# Patient Record
Sex: Female | Born: 1970 | Race: Black or African American | Hispanic: No | Marital: Married | Smoking: Never smoker
Health system: Southern US, Community
[De-identification: ages and names within clinical notes are randomized; demographics above are authoritative.]

## PROBLEM LIST (undated history)

## (undated) DIAGNOSIS — E782 Mixed hyperlipidemia: Secondary | ICD-10-CM

## (undated) DIAGNOSIS — R51 Headache: Secondary | ICD-10-CM

## (undated) DIAGNOSIS — I10 Essential (primary) hypertension: Secondary | ICD-10-CM

## (undated) DIAGNOSIS — Z8719 Personal history of other diseases of the digestive system: Secondary | ICD-10-CM

## (undated) DIAGNOSIS — J45909 Unspecified asthma, uncomplicated: Secondary | ICD-10-CM

## (undated) DIAGNOSIS — K219 Gastro-esophageal reflux disease without esophagitis: Secondary | ICD-10-CM

## (undated) DIAGNOSIS — T4145XA Adverse effect of unspecified anesthetic, initial encounter: Secondary | ICD-10-CM

## (undated) DIAGNOSIS — T8859XA Other complications of anesthesia, initial encounter: Secondary | ICD-10-CM

## (undated) DIAGNOSIS — R519 Headache, unspecified: Secondary | ICD-10-CM

## (undated) DIAGNOSIS — E559 Vitamin D deficiency, unspecified: Secondary | ICD-10-CM

## (undated) DIAGNOSIS — R011 Cardiac murmur, unspecified: Secondary | ICD-10-CM

## (undated) DIAGNOSIS — Z889 Allergy status to unspecified drugs, medicaments and biological substances status: Secondary | ICD-10-CM

## (undated) HISTORY — PX: TONSILLECTOMY: SUR1361

## (undated) HISTORY — PX: NISSEN FUNDOPLICATION: SHX2091

## (undated) HISTORY — PX: UPPER GI ENDOSCOPY: SHX6162

## (undated) HISTORY — PX: HIATAL HERNIA REPAIR: SHX195

## (undated) HISTORY — PX: COLONOSCOPY: SHX174

## (undated) HISTORY — DX: Mixed hyperlipidemia: E78.2

## (undated) HISTORY — PX: ABDOMINAL HYSTERECTOMY: SHX81

## (undated) HISTORY — PX: HERNIA REPAIR: SHX51

## (undated) HISTORY — DX: Personal history of other diseases of the digestive system: Z87.19

## (undated) HISTORY — DX: Vitamin D deficiency, unspecified: E55.9

---
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
## 2014-04-11 ENCOUNTER — Other Ambulatory Visit (INDEPENDENT_AMBULATORY_CARE_PROVIDER_SITE_OTHER): Payer: Self-pay | Admitting: Otolaryngology

## 2014-04-11 DIAGNOSIS — J329 Chronic sinusitis, unspecified: Secondary | ICD-10-CM

## 2014-04-14 ENCOUNTER — Ambulatory Visit
Admission: RE | Admit: 2014-04-14 | Discharge: 2014-04-14 | Disposition: A | Discharge status: Home / Self Care | Payer: 59 | Source: Ambulatory Visit | Attending: Otolaryngology | Admitting: Otolaryngology

## 2014-04-14 DIAGNOSIS — J329 Chronic sinusitis, unspecified: Secondary | ICD-10-CM

## 2014-05-01 ENCOUNTER — Other Ambulatory Visit: Payer: Self-pay | Admitting: Otolaryngology

## 2014-05-10 ENCOUNTER — Encounter (HOSPITAL_BASED_OUTPATIENT_CLINIC_OR_DEPARTMENT_OTHER): Payer: Self-pay | Admitting: *Deleted

## 2014-05-10 NOTE — Progress Notes (Signed)
To come in for ekg-bmet-recently moved from [place]-no md here yet

## 2014-05-11 ENCOUNTER — Other Ambulatory Visit: Payer: Self-pay

## 2014-05-11 ENCOUNTER — Encounter (HOSPITAL_BASED_OUTPATIENT_CLINIC_OR_DEPARTMENT_OTHER)
Admission: RE | Admit: 2014-05-11 | Discharge: 2014-05-11 | Disposition: A | Discharge status: Home / Self Care | Payer: 59 | Source: Ambulatory Visit | Attending: Otolaryngology | Admitting: Otolaryngology

## 2014-05-11 DIAGNOSIS — Z01812 Encounter for preprocedural laboratory examination: Secondary | ICD-10-CM | POA: Diagnosis not present

## 2014-05-11 DIAGNOSIS — Z01810 Encounter for preprocedural cardiovascular examination: Secondary | ICD-10-CM | POA: Insufficient documentation

## 2014-05-11 LAB — BASIC METABOLIC PANEL
Anion gap: 9 (ref 5–15)
BUN: 12 mg/dL (ref 6–23)
CALCIUM: 8.8 mg/dL (ref 8.4–10.5)
CO2: 26 mmol/L (ref 19–32)
Chloride: 100 mmol/L (ref 96–112)
Creatinine, Ser: 0.97 mg/dL (ref 0.50–1.10)
GFR calc Af Amer: 80 mL/min — ABNORMAL LOW (ref >90)
GFR calc non Af Amer: 69 mL/min — ABNORMAL LOW (ref >90)
GLUCOSE: 95 mg/dL (ref 70–99)
POTASSIUM: 4.6 mmol/L (ref 3.5–5.1)
Sodium: 135 mmol/L (ref 135–145)

## 2014-05-16 ENCOUNTER — Ambulatory Visit (HOSPITAL_BASED_OUTPATIENT_CLINIC_OR_DEPARTMENT_OTHER)
Admission: RE | Admit: 2014-05-16 | Discharge: 2014-05-16 | Disposition: A | Discharge status: Home / Self Care | Payer: 59 | Source: Ambulatory Visit | Attending: Otolaryngology | Admitting: Otolaryngology

## 2014-05-16 ENCOUNTER — Ambulatory Visit (HOSPITAL_BASED_OUTPATIENT_CLINIC_OR_DEPARTMENT_OTHER): Payer: 59 | Admitting: Anesthesiology

## 2014-05-16 ENCOUNTER — Encounter (HOSPITAL_BASED_OUTPATIENT_CLINIC_OR_DEPARTMENT_OTHER): Payer: Self-pay | Admitting: Anesthesiology

## 2014-05-16 ENCOUNTER — Encounter (HOSPITAL_BASED_OUTPATIENT_CLINIC_OR_DEPARTMENT_OTHER)
Admission: RE | Disposition: A | Discharge status: Home / Self Care | Payer: Self-pay | Source: Ambulatory Visit | Attending: Otolaryngology

## 2014-05-16 DIAGNOSIS — J343 Hypertrophy of nasal turbinates: Secondary | ICD-10-CM | POA: Insufficient documentation

## 2014-05-16 DIAGNOSIS — I10 Essential (primary) hypertension: Secondary | ICD-10-CM | POA: Diagnosis not present

## 2014-05-16 DIAGNOSIS — Z79899 Other long term (current) drug therapy: Secondary | ICD-10-CM | POA: Diagnosis not present

## 2014-05-16 DIAGNOSIS — J342 Deviated nasal septum: Secondary | ICD-10-CM | POA: Diagnosis present

## 2014-05-16 DIAGNOSIS — J45909 Unspecified asthma, uncomplicated: Secondary | ICD-10-CM | POA: Insufficient documentation

## 2014-05-16 HISTORY — PX: TURBINATE REDUCTION: SHX6157

## 2014-05-16 HISTORY — DX: Unspecified asthma, uncomplicated: J45.909

## 2014-05-16 HISTORY — DX: Allergy status to unspecified drugs, medicaments and biological substances: Z88.9

## 2014-05-16 HISTORY — DX: Essential (primary) hypertension: I10

## 2014-05-16 HISTORY — PX: SEPTOPLASTY: SHX2393

## 2014-05-16 HISTORY — DX: Gastro-esophageal reflux disease without esophagitis: K21.9

## 2014-05-16 HISTORY — DX: Cardiac murmur, unspecified: R01.1

## 2014-05-16 LAB — POCT HEMOGLOBIN-HEMACUE: Hemoglobin: 13.9 g/dL (ref 12.0–15.0)

## 2014-05-16 SURGERY — SEPTOPLASTY, NOSE
Anesthesia: General

## 2014-05-16 MED ORDER — DEXAMETHASONE SODIUM PHOSPHATE 4 MG/ML IJ SOLN
INTRAMUSCULAR | Status: DC | PRN
  Administered 2014-05-16: 10 mg via INTRAVENOUS

## 2014-05-16 MED ORDER — OXYCODONE-ACETAMINOPHEN 5-325 MG PO TABS: 1.0000 | ORAL_TABLET | ORAL | Status: DC | PRN

## 2014-05-16 MED ORDER — MUPIROCIN 2 % EX OINT
TOPICAL_OINTMENT | CUTANEOUS | Status: DC | PRN
  Administered 2014-05-16: 1 via NASAL

## 2014-05-16 MED ORDER — HYDROMORPHONE HCL 1 MG/ML IJ SOLN
INTRAMUSCULAR | Status: AC
  Filled 2014-05-16: qty 1

## 2014-05-16 MED ORDER — PROMETHAZINE HCL 25 MG/ML IJ SOLN: 6.2500 mg | INTRAMUSCULAR | Status: DC | PRN

## 2014-05-16 MED ORDER — OXYMETAZOLINE HCL 0.05 % NA SOLN
NASAL | Status: DC | PRN
  Administered 2014-05-16: 1 via NASAL

## 2014-05-16 MED ORDER — LACTATED RINGERS IV SOLN
INTRAVENOUS | Status: DC
  Administered 2014-05-16 (×3): via INTRAVENOUS

## 2014-05-16 MED ORDER — MIDAZOLAM HCL 2 MG/2ML IJ SOLN
1.0000 mg | INTRAMUSCULAR | Status: DC | PRN
  Administered 2014-05-16: 2 mg via INTRAVENOUS

## 2014-05-16 MED ORDER — LIDOCAINE-EPINEPHRINE 1 %-1:100000 IJ SOLN
INTRAMUSCULAR | Status: DC | PRN
  Administered 2014-05-16: 2 mL

## 2014-05-16 MED ORDER — OXYMETAZOLINE HCL 0.05 % NA SOLN
NASAL | Status: AC
  Filled 2014-05-16: qty 15

## 2014-05-16 MED ORDER — MEPERIDINE HCL 25 MG/ML IJ SOLN: 6.2500 mg | INTRAMUSCULAR | Status: DC | PRN

## 2014-05-16 MED ORDER — FENTANYL CITRATE 0.05 MG/ML IJ SOLN
50.0000 ug | INTRAMUSCULAR | Status: DC | PRN
  Administered 2014-05-16: 100 ug via INTRAVENOUS

## 2014-05-16 MED ORDER — LIDOCAINE-EPINEPHRINE 1 %-1:100000 IJ SOLN
INTRAMUSCULAR | Status: AC
  Filled 2014-05-16: qty 1

## 2014-05-16 MED ORDER — LIDOCAINE HCL (CARDIAC) 20 MG/ML IV SOLN
INTRAVENOUS | Status: DC | PRN
Start: 2014-05-16 — End: 2014-05-16
  Administered 2014-05-16: 50 mg via INTRAVENOUS

## 2014-05-16 MED ORDER — PROPOFOL 10 MG/ML IV BOLUS
INTRAVENOUS | Status: DC | PRN
  Administered 2014-05-16: 200 mg via INTRAVENOUS

## 2014-05-16 MED ORDER — EPINEPHRINE HCL 1 MG/ML IJ SOLN
INTRAMUSCULAR | Status: AC
  Filled 2014-05-16: qty 1

## 2014-05-16 MED ORDER — AMOXICILLIN 875 MG PO TABS: 875.0000 mg | ORAL_TABLET | Freq: Two times a day (BID) | ORAL | Status: DC

## 2014-05-16 MED ORDER — CEFAZOLIN SODIUM-DEXTROSE 2-3 GM-% IV SOLR
INTRAVENOUS | Status: DC | PRN
  Administered 2014-05-16: 2 g via INTRAVENOUS

## 2014-05-16 MED ORDER — BACITRACIN ZINC 500 UNIT/GM EX OINT
TOPICAL_OINTMENT | CUTANEOUS | Status: AC
  Filled 2014-05-16: qty 28.35

## 2014-05-16 MED ORDER — ONDANSETRON HCL 4 MG/2ML IJ SOLN
INTRAMUSCULAR | Status: DC | PRN
  Administered 2014-05-16: 4 mg via INTRAVENOUS

## 2014-05-16 MED ORDER — SUCCINYLCHOLINE CHLORIDE 20 MG/ML IJ SOLN
INTRAMUSCULAR | Status: DC | PRN
  Administered 2014-05-16: 100 mg via INTRAVENOUS

## 2014-05-16 MED ORDER — MIDAZOLAM HCL 2 MG/2ML IJ SOLN
INTRAMUSCULAR | Status: AC
  Filled 2014-05-16: qty 2

## 2014-05-16 MED ORDER — MUPIROCIN 2 % EX OINT
TOPICAL_OINTMENT | CUTANEOUS | Status: AC
  Filled 2014-05-16: qty 22

## 2014-05-16 MED ORDER — MIDAZOLAM HCL 2 MG/2ML IJ SOLN: 0.5000 mg | Freq: Once | INTRAMUSCULAR | Status: DC | PRN

## 2014-05-16 MED ORDER — HYDROMORPHONE HCL 1 MG/ML IJ SOLN
0.2500 mg | INTRAMUSCULAR | Status: DC | PRN
  Administered 2014-05-16 (×3): 0.5 mg via INTRAVENOUS

## 2014-05-16 MED ORDER — BACITRACIN ZINC 500 UNIT/GM EX OINT
TOPICAL_OINTMENT | CUTANEOUS | Status: AC
  Filled 2014-05-16: qty 0.9

## 2014-05-16 MED ORDER — OXYCODONE HCL 5 MG PO TABS
5.0000 mg | ORAL_TABLET | Freq: Once | ORAL | Status: AC
  Administered 2014-05-16: 5 mg via ORAL

## 2014-05-16 MED ORDER — OXYCODONE HCL 5 MG PO TABS
ORAL_TABLET | ORAL | Status: AC
  Filled 2014-05-16: qty 1

## 2014-05-16 MED ORDER — FENTANYL CITRATE (PF) 100 MCG/2ML IJ SOLN
INTRAMUSCULAR | Status: AC
  Filled 2014-05-16: qty 4

## 2014-05-16 SURGICAL SUPPLY — 36 items
ATTRACTOMAT 16X20 MAGNETIC DRP (DRAPES) IMPLANT
BLADE SURG 15 STRL LF DISP TIS (BLADE) ×2 IMPLANT
BLADE SURG 15 STRL SS (BLADE) ×1
CANISTER SUCT 1200ML W/VALVE (MISCELLANEOUS) ×3 IMPLANT
COAGULATOR SUCT 8FR VV (MISCELLANEOUS) ×3 IMPLANT
DECANTER SPIKE VIAL GLASS SM (MISCELLANEOUS) IMPLANT
DRSG NASOPORE 8CM (GAUZE/BANDAGES/DRESSINGS) IMPLANT
DRSG TELFA 3X8 NADH (GAUZE/BANDAGES/DRESSINGS) IMPLANT
ELECT REM PT RETURN 9FT ADLT (ELECTROSURGICAL) ×3
ELECTRODE REM PT RTRN 9FT ADLT (ELECTROSURGICAL) ×2 IMPLANT
GLOVE BIO SURGEON STRL SZ7 (GLOVE) ×3 IMPLANT
GLOVE BIO SURGEON STRL SZ7.5 (GLOVE) ×3 IMPLANT
GLOVE BIOGEL PI IND STRL 7.5 (GLOVE) ×2 IMPLANT
GLOVE BIOGEL PI INDICATOR 7.5 (GLOVE) ×1
GLOVE SURG SS PI 7.0 STRL IVOR (GLOVE) ×3 IMPLANT
GOWN STRL REUS W/ TWL LRG LVL3 (GOWN DISPOSABLE) ×6 IMPLANT
GOWN STRL REUS W/TWL LRG LVL3 (GOWN DISPOSABLE) ×3
NEEDLE HYPO 25X1 1.5 SAFETY (NEEDLE) ×3 IMPLANT
NS IRRIG 1000ML POUR BTL (IV SOLUTION) ×3 IMPLANT
PACK BASIN DAY SURGERY FS (CUSTOM PROCEDURE TRAY) ×3 IMPLANT
PACK ENT DAY SURGERY (CUSTOM PROCEDURE TRAY) IMPLANT
PATTIES SURGICAL .5 X3 (DISPOSABLE) IMPLANT
SLEEVE SCD COMPRESS KNEE MED (MISCELLANEOUS) ×3 IMPLANT
SOLUTION BUTLER CLEAR DIP (MISCELLANEOUS) ×3 IMPLANT
SPLINT NASAL AIRWAY SILICONE (MISCELLANEOUS) ×3 IMPLANT
SPONGE GAUZE 2X2 8PLY STRL LF (GAUZE/BANDAGES/DRESSINGS) ×3 IMPLANT
SPONGE NEURO XRAY DETECT 1X3 (DISPOSABLE) ×3 IMPLANT
SUT CHROMIC 4 0 P 3 18 (SUTURE) ×3 IMPLANT
SUT PLAIN 4 0 ~~LOC~~ 1 (SUTURE) ×3 IMPLANT
SUT PROLENE 3 0 PS 2 (SUTURE) ×3 IMPLANT
SUT VIC AB 4-0 P-3 18XBRD (SUTURE) IMPLANT
SUT VIC AB 4-0 P3 18 (SUTURE)
TOWEL OR 17X24 6PK STRL BLUE (TOWEL DISPOSABLE) ×3 IMPLANT
TUBE SALEM SUMP 12R W/ARV (TUBING) IMPLANT
TUBE SALEM SUMP 16 FR W/ARV (TUBING) IMPLANT
YANKAUER SUCT BULB TIP NO VENT (SUCTIONS) ×3 IMPLANT

## 2014-05-16 NOTE — Transfer of Care (Signed)
Immediate Anesthesia Transfer of Care Note  Patient: Jean Kemp  Procedure(s) Performed: Procedure(s): SEPTOPLASTY (N/A) BILATERAL TURBINATE REDUCTION (Bilateral)  Patient Location: PACU  Anesthesia Type:General  Level of Consciousness: sedated  Airway & Oxygen Therapy: Patient Spontanous Breathing and Patient connected to face mask oxygen  Post-op Assessment: Report given to RN and Post -op Vital signs reviewed and stable  Post vital signs: Reviewed and stable  Last Vitals:  Filed Vitals:   05/16/14 0652  BP: 129/75  Pulse: 72  Temp: 36.5 C  Resp: 18    Complications: No apparent anesthesia complications

## 2014-05-16 NOTE — H&P (Signed)
H&P Update  Pt's original H&P dated 04/26/14 reviewed and placed in chart (to be scanned).  I personally examined the patient today.  No change in health. Proceed with septoplasty and bilateral turbinate reduction.

## 2014-05-16 NOTE — Anesthesia Postprocedure Evaluation (Signed)
  Anesthesia Post-op Note  Patient: Jean Kemp  Procedure(s) Performed: Procedure(s): SEPTOPLASTY (N/A) BILATERAL TURBINATE REDUCTION (Bilateral)  Patient Location: PACU  Anesthesia Type: General   Level of Consciousness: awake, alert  and oriented  Airway and Oxygen Therapy: Patient Spontanous Breathing  Post-op Pain: mild  Post-op Assessment: Post-op Vital signs reviewed  Post-op Vital Signs: Reviewed  Last Vitals:  Filed Vitals:   05/16/14 0945  BP: 141/86  Pulse: 91  Temp:   Resp: 14    Complications: No apparent anesthesia complications

## 2014-05-16 NOTE — Op Note (Signed)
DATE OF PROCEDURE: 05/16/2014  OPERATIVE REPORT   SURGEON: Leta Baptist, MD   PREOPERATIVE DIAGNOSES:  1. Severe nasal septal deviation.  2. Bilateral inferior turbinate hypertrophy.  3. Chronic nasal obstruction.  POSTOPERATIVE DIAGNOSES:  1. Severe nasal septal deviation.  2. Bilateral inferior turbinate hypertrophy.  3. Chronic nasal obstruction.  PROCEDURE PERFORMED:  1. Septoplasty.  2. Bilateral partial inferior turbinate resection.   ANESTHESIA: General endotracheal tube anesthesia.   COMPLICATIONS: None.   ESTIMATED BLOOD LOSS: Less than 20 mL.   INDICATION FOR PROCEDURE: Jean Kemp is a 44 y.o. female with a history of chronic nasal obstruction. The patient was treated with antihistamine, decongestant, steroid nasal spray, and systemic steroids. However, the patient continues to be symptomatic. On examination, the patient was noted to have bilateral severe inferior turbinate hypertrophy and significant nasal septal deviation, causing significant nasal obstruction. Based on the above findings, the decision was made for the patient to undergo the above-stated procedure. The risks, benefits, alternatives, and details of the procedure were discussed with the patient. Questions were invited and answered. Informed consent was obtained.   DESCRIPTION OF PROCEDURE: The patient was taken to the operating room and placed supine on the operating table. General endotracheal tube anesthesia was administered by the anesthesiologist. The patient was positioned, and prepped and draped in the standard fashion for nasal surgery. Pledgets soaked with Afrin were placed in both nasal cavities for decongestion. The pledgets were subsequently removed. The above mentioned severe septal deviation was again noted. 1% lidocaine with 1:100,000 epinephrine was injected onto the nasal septum bilaterally. A hemitransfixion incision was made on the left side. The mucosal flap was carefully elevated on the left  side. A cartilaginous incision was made 1 cm superior to the caudal margin of the nasal septum. Mucosal flap was also elevated on the right side in the similar fashion. It should be noted that due to the severe septal deviation, the deviated portion of the cartilaginous and bony septum had to be removed in piecemeal fashion. Once the deviated portions were removed, a straight midline septum was achieved. The septum was then quilted with 4-0 plain gut sutures. The hemitransfixion incision was closed with interrupted 4-0 chromic sutures. Doyle splints were applied.   Prior to the [facility] splint application, the inferior one half of both hypertrophied inferior turbinate was crossclamped with a Kelly clamp. The inferior one half of each inferior turbinate was then resected with a pair of cross cutting scissors. Hemostasis was achieved with a suction cautery device.   The care of the patient was turned over to the anesthesiologist. The patient was awakened from anesthesia without difficulty. The patient was extubated and transferred to the recovery room in good condition.   OPERATIVE FINDINGS: Severe nasal septal deviation and bilateral inferior turbinate hypertrophy.   SPECIMEN: None.   FOLLOWUP CARE: The patient be discharged home once she is awake and alert. The patient will be placed on Percocet 1-2 tablets p.o. q.6 hours p.r.n. pain, and amoxicillin 875 mg p.o. b.i.d. for 5 days. The patient will follow up in my office in approximately 1 week for splint removal.   Linkyn Gobin Raynelle Bring, MD

## 2014-05-16 NOTE — Anesthesia Procedure Notes (Signed)
Procedure Name: Intubation Date/Time: 05/16/2014 7:57 AM Performed by: Lieutenant Diego Pre-anesthesia Checklist: Patient identified, Emergency Drugs available, Suction available and Patient being monitored Patient Re-evaluated:Patient Re-evaluated prior to inductionOxygen Delivery Method: Circle System Utilized Preoxygenation: Pre-oxygenation with 100% oxygen Intubation Type: IV induction Ventilation: Mask ventilation without difficulty Laryngoscope Size: Miller and 2 Grade View: Grade I Tube type: Oral Number of attempts: 1 Airway Equipment and Method: Stylet and Oral airway Placement Confirmation: ETT inserted through vocal cords under direct vision,  positive ETCO2 and breath sounds checked- equal and bilateral Secured at: 21 cm Tube secured with: Tape Dental Injury: Teeth and Oropharynx as per pre-operative assessment

## 2014-05-16 NOTE — Anesthesia Preprocedure Evaluation (Addendum)
Anesthesia Evaluation  Patient identified by MRN, date of birth, ID band Patient awake    Reviewed: Allergy & Precautions, NPO status , Patient's Chart, lab work & pertinent test results  History of Anesthesia Complications Negative for: history of anesthetic complications  Airway Mallampati: I  TM Distance: >3 FB Neck ROM: Full    Dental  (+) Teeth Intact, Dental Advisory Given   Pulmonary neg pulmonary ROS, asthma (well controlled, never needs inhaler) ,  breath sounds clear to auscultation        Cardiovascular hypertension, Pt. on medications Rhythm:Regular Rate:Normal + Systolic Click    Neuro/Psych negative neurological ROS     GI/Hepatic Neg liver ROS, GERD-  Controlled,S/p [employer]   Endo/Other  negative endocrine ROS  Renal/GU negative Renal ROS     Musculoskeletal   Abdominal   Peds  Hematology negative hematology ROS (+)   Anesthesia Other Findings   Reproductive/Obstetrics                            Anesthesia Physical Anesthesia Plan  ASA: II  Anesthesia Plan: General   Post-op Pain Management:    Induction: Intravenous  Airway Management Planned: Oral ETT  Additional Equipment:   Intra-op Plan:   Post-operative Plan: Extubation in OR  Informed Consent: I have reviewed the patients History and Physical, chart, labs and discussed the procedure including the risks, benefits and alternatives for the proposed anesthesia with the patient or authorized representative who has indicated his/her understanding and acceptance.   Dental advisory given  Plan Discussed with: CRNA and Surgeon  Anesthesia Plan Comments: (Plan routine monitors, GETA)        Anesthesia Quick Evaluation

## 2014-05-16 NOTE — Discharge Instructions (Addendum)

## 2014-05-17 ENCOUNTER — Encounter (HOSPITAL_BASED_OUTPATIENT_CLINIC_OR_DEPARTMENT_OTHER): Payer: Self-pay | Admitting: Otolaryngology

## 2015-01-03 LAB — HEPATIC FUNCTION PANEL
ALT: 18 U/L (ref 7–35)
AST: 13 U/L (ref 13–35)

## 2015-01-03 LAB — LIPID PANEL
Cholesterol: 265 mg/dL — AB (ref 0–200)
HDL: 82 mg/dL — AB (ref 35–70)
LDL Cholesterol: 188 mg/dL

## 2015-01-03 LAB — TSH: TSH: 1.17 u[IU]/mL (ref 0.41–5.90)

## 2015-01-03 LAB — CBC AND DIFFERENTIAL
Hemoglobin: 15 g/dL (ref 12.0–16.0)
PLATELETS: 414 10*3/uL — AB (ref 150–399)
WBC: 5.9 10*3/mL

## 2015-01-03 LAB — BASIC METABOLIC PANEL
BUN: 1 mg/dL — AB (ref 4–21)
GLUCOSE: 123 mg/dL
POTASSIUM: 4.8 mmol/L (ref 3.4–5.3)
SODIUM: 135 mmol/L — AB (ref 137–147)

## 2015-01-03 LAB — HEMOGLOBIN A1C: Hemoglobin A1C: 5.7

## 2015-01-03 LAB — HM MAMMOGRAPHY

## 2015-01-03 LAB — CALCIUM: CALCIUM: 10.1 mg/dL

## 2015-02-02 ENCOUNTER — Encounter: Payer: Self-pay | Admitting: Emergency Medicine

## 2015-02-02 ENCOUNTER — Emergency Department (INDEPENDENT_AMBULATORY_CARE_PROVIDER_SITE_OTHER)
Admission: EM | Admit: 2015-02-02 | Discharge: 2015-02-02 | Disposition: A | Discharge status: Home / Self Care | Payer: 59 | Source: Home / Self Care | Attending: Family Medicine | Admitting: Family Medicine

## 2015-02-02 DIAGNOSIS — J0100 Acute maxillary sinusitis, unspecified: Secondary | ICD-10-CM

## 2015-02-02 DIAGNOSIS — J029 Acute pharyngitis, unspecified: Secondary | ICD-10-CM

## 2015-02-02 LAB — POCT RAPID STREP A (OFFICE): Rapid Strep A Screen: NEGATIVE

## 2015-02-02 MED ORDER — AMOXICILLIN 875 MG PO TABS: 875.0000 mg | ORAL_TABLET | Freq: Two times a day (BID) | ORAL | Status: DC

## 2015-02-02 MED ORDER — PREDNISONE 20 MG PO TABS: 20.0000 mg | ORAL_TABLET | Freq: Two times a day (BID) | ORAL | Status: DC

## 2015-02-02 NOTE — Discharge Instructions (Signed)
Take plain guaifenesin (1200mg  extended release tabs such as Mucinex) twice daily, with plenty of water, for cough and congestion.  May add Pseudoephedrine (30mg , one or two every 4 to 6 hours) for sinus congestion.  Get adequate rest.   May use Afrin nasal spray (or generic oxymetazoline) twice daily for about 5 days and then discontinue.  Also recommend using saline nasal spray several times daily and saline nasal irrigation (AYR is a common brand).  Use Flonase nasal spray each morning after using Afrin nasal spray and saline nasal irrigation. Try warm salt water gargles for sore throat.  Stop all antihistamines for now, and other non-prescription cough/cold preparations. May take Delsym Cough Suppressant at bedtime for nighttime cough.    Follow-up with family doctor if not improving about 7 to10 days.

## 2015-02-02 NOTE — ED Notes (Signed)
Sinus pain, pressure, headache left earache, congestion, yellow-to green mucus x 2 weeks worse in the last two days

## 2015-02-02 NOTE — ED Provider Notes (Signed)
CSN: [id number]     Arrival date & time 02/02/15  0854 History   First MD Initiated Contact with Patient 02/02/15 [phone]     Chief Complaint  Patient presents with  . Sinus Problem      HPI Comments: Patient reports that she had a cold-like illness about 2 weeks ago that generally improved except for persistent nasal congestion.  Yesterday she developed left earache, left sinus headache, and mild sore throat. She has a past history of septoplasty in April 2016.  In March of 2016 a maxillofacial CT showed mucosal thickening of her left maxillary sinuses.  The history is provided by the patient.    Past Medical History  Diagnosis Date  . Multiple allergies   . Asthma   . Hypertension   . GERD (gastroesophageal reflux disease)   . Heart murmur     mvp-never had echo   Past Surgical History  Procedure Laterality Date  . Nissen fundoplication    . Hiatal hernia repair    . Abdominal hysterectomy    . Upper gi endoscopy    . Colonoscopy    . Tonsillectomy    . Septoplasty N/A 05/16/2014    Procedure: SEPTOPLASTY;  Surgeon: Leta Baptist, MD;  Location: [place];  Service: ENT;  Laterality: N/A;  . Turbinate reduction Bilateral 05/16/2014    Procedure: BILATERAL TURBINATE REDUCTION;  Surgeon: Leta Baptist, MD;  Location: [place];  Service: ENT;  Laterality: Bilateral;   No family history on file. Social History  Substance Use Topics  . Smoking status: Never Smoker   . Smokeless tobacco: None  . Alcohol Use: Yes     Comment: occ   OB History    No data available     Review of Systems + sore throat + cough + sneezing No pleuritic pain No wheezing + nasal congestion + post-nasal drainage + sinus pain/pressure No itchy/red eyes ? earache No hemoptysis No SOB No fever/chills No nausea No vomiting No abdominal pain No diarrhea No urinary symptoms No skin rash + fatigue No myalgias + headache Used OTC meds without relief  Allergies  Asa;  Ibuprofen; and Sulfa antibiotics  Home Medications   Prior to Admission medications   Medication Sig Start Date End Date Taking? Authorizing Provider  amoxicillin (AMOXIL) 875 MG tablet Take 1 tablet (875 mg total) by mouth 2 (two) times daily. 02/02/15   Kandra Nicolas, MD  Eflornithine HCl 13.9 % cream Apply 13.9 % topically 2 (two) times daily with a meal.    Historical Provider, MD  levocetirizine (XYZAL) 5 MG tablet Take 5 mg by mouth every evening.    Historical Provider, MD  montelukast (SINGULAIR) 10 MG tablet Take 10 mg by mouth at bedtime.    Historical Provider, MD  oxyCODONE-acetaminophen (ROXICET) 5-325 MG per tablet Take 1 tablet by mouth every 4 (four) hours as needed for moderate pain or severe pain. 05/16/14   Leta Baptist, MD  predniSONE (DELTASONE) 20 MG tablet Take 1 tablet (20 mg total) by mouth 2 (two) times daily. Take with food. 02/02/15   Kandra Nicolas, MD  spironolactone (ALDACTONE) 100 MG tablet Take 100 mg by mouth daily.    Historical Provider, MD  tretinoin (RETIN-A) 0.1 % cream Apply topically 2 (two) times daily.    Historical Provider, MD   Meds Ordered and Administered this Visit  Medications - No data to display  BP 126/91 mmHg  Pulse 101  Temp(Src) 97.7 F (  36.5 C) (Oral)  Ht 5\' 4"  (1.626 m)  Wt 146 lb (66.225 kg)  BMI 25.05 kg/m2  SpO2 98% No data found.   Physical Exam Nursing notes and Vital Signs reviewed. Appearance:  Patient appears stated age, and in no acute distress Eyes:  Pupils are equal, round, and reactive to light and accomodation.  Extraocular movement is intact.  Conjunctivae are not inflamed  Ears:  Canals normal.  Tympanic membranes normal.  Nose:  Congested turbinates.  Maxillary sinus tenderness is present.  Pharynx:  Normal Neck:  Supple.  Nontender enlarged posterior nodes are palpated bilaterally  Lungs:  Clear to auscultation.  Breath sounds are equal.  Moving air well. Heart:  Regular rate and rhythm without murmurs, rubs, or  gallops.  Abdomen:  Nontender without masses or hepatosplenomegaly.  Bowel sounds are present.  No CVA or flank tenderness.  Extremities:  No edema.  Skin:  No rash present.   ED Course  Procedures  None    Labs Reviewed  POCT RAPID STREP A (OFFICE) negative     MDM   1. Acute pharyngitis, unspecified etiology; probably viral   2. Acute maxillary sinusitis, recurrence not specified    With a past history of sinusitis and sinus surgery, will begin prednisone burst.  Begin amoxicillin 875mg  BID Take plain guaifenesin (1200mg  extended release tabs such as Mucinex) twice daily, with plenty of water, for cough and congestion.  May add Pseudoephedrine (30mg , one or two every 4 to 6 hours) for sinus congestion.  Get adequate rest.   May use Afrin nasal spray (or generic oxymetazoline) twice daily for about 5 days and then discontinue.  Also recommend using saline nasal spray several times daily and saline nasal irrigation (AYR is a common brand).  Use Flonase nasal spray each morning after using Afrin nasal spray and saline nasal irrigation. Try warm salt water gargles for sore throat.  Stop all antihistamines for now, and other non-prescription cough/cold preparations. May take Delsym Cough Suppressant at bedtime for nighttime cough.    Follow-up with family doctor if not improving about 7 to10 days.     Kandra Nicolas, MD 02/06/15 Vernelle Emerald

## 2015-05-03 ENCOUNTER — Encounter: Payer: Self-pay | Admitting: *Deleted

## 2015-05-03 ENCOUNTER — Emergency Department (INDEPENDENT_AMBULATORY_CARE_PROVIDER_SITE_OTHER)
Admission: EM | Admit: 2015-05-03 | Discharge: 2015-05-03 | Disposition: A | Discharge status: Home / Self Care | Payer: 59 | Source: Home / Self Care | Attending: Family Medicine | Admitting: Family Medicine

## 2015-05-03 DIAGNOSIS — J069 Acute upper respiratory infection, unspecified: Secondary | ICD-10-CM

## 2015-05-03 DIAGNOSIS — B9789 Other viral agents as the cause of diseases classified elsewhere: Principal | ICD-10-CM

## 2015-05-03 DIAGNOSIS — J0101 Acute recurrent maxillary sinusitis: Secondary | ICD-10-CM | POA: Diagnosis not present

## 2015-05-03 MED ORDER — TRIAMCINOLONE ACETONIDE 40 MG/ML IJ SUSP
40.0000 mg | Freq: Once | INTRAMUSCULAR | Status: AC
Start: 2015-05-03 — End: 2015-05-03
  Administered 2015-05-03: 40 mg via INTRAMUSCULAR

## 2015-05-03 MED ORDER — AMOXICILLIN 875 MG PO TABS: 875.0000 mg | ORAL_TABLET | Freq: Two times a day (BID) | ORAL | Status: DC

## 2015-05-03 MED ORDER — BENZONATATE 200 MG PO CAPS: 200.0000 mg | ORAL_CAPSULE | Freq: Every day | ORAL | Status: DC

## 2015-05-03 NOTE — Discharge Instructions (Signed)
Continue Mucinex D, with plenty of water, for cough and congestion.  Get adequate rest.   May use Afrin nasal spray (or generic oxymetazoline) twice daily for about 5 days and then discontinue.  Also recommend using saline nasal spray several times daily and saline nasal irrigation (AYR is a common brand).  Use Flonase nasal spray each morning after using Afrin nasal spray and saline nasal irrigation. Continue Singulair. Try warm salt water gargles for sore throat.  Stop all antihistamines for now, and other non-prescription cough/cold preparations.  Follow-up with family doctor if not improving about one week.

## 2015-05-03 NOTE — ED Provider Notes (Signed)
CSN: [id number]     Arrival date & time 05/03/15  0940 History   First MD Initiated Contact with Patient 05/03/15 [phone]     Chief Complaint  Patient presents with  . Cough  . Sore Throat      HPI Comments: About 2 weeks ago patient developed typical cold-like symptoms developing over several days,  including mild sore throat, sinus congestion, headache, fatigue, and cough.  Her sinus congestion and cough have persisted.  Her cough is worse at night. She has a history of seasonal rhinitis, and past sinus surgery.  She has multiple relatives who have asthma.  The history is provided by the patient.    Past Medical History  Diagnosis Date  . Multiple allergies   . Asthma   . Hypertension   . GERD (gastroesophageal reflux disease)   . Heart murmur     mvp-never had echo   Past Surgical History  Procedure Laterality Date  . Nissen fundoplication    . Hiatal hernia repair    . Abdominal hysterectomy    . Upper gi endoscopy    . Colonoscopy    . Tonsillectomy    . Septoplasty N/A 05/16/2014    Procedure: SEPTOPLASTY;  Surgeon: Leta Baptist, MD;  Location: [place];  Service: ENT;  Laterality: N/A;  . Turbinate reduction Bilateral 05/16/2014    Procedure: BILATERAL TURBINATE REDUCTION;  Surgeon: Leta Baptist, MD;  Location: [place];  Service: ENT;  Laterality: Bilateral;   History reviewed. No pertinent family history. Social History  Substance Use Topics  . Smoking status: Never Smoker   . Smokeless tobacco: None  . Alcohol Use: Yes     Comment: occ   OB History    No data available     Review of Systems + sore throat + cough No pleuritic pain No wheezing + nasal congestion + post-nasal drainage + sinus pain/pressure No itchy/red eyes ? earache No hemoptysis No SOB No fever/chills No nausea No vomiting No abdominal pain No diarrhea No urinary symptoms No skin rash + fatigue No myalgias + headache Used OTC meds without relief   Allergies  Asa; Ibuprofen; and Sulfa antibiotics  Home Medications   Prior to Admission medications   Medication Sig Start Date End Date Taking? Authorizing Provider  montelukast (SINGULAIR) 10 MG tablet Take 10 mg by mouth at bedtime.   Yes Historical Provider, MD  spironolactone (ALDACTONE) 100 MG tablet Take 100 mg by mouth daily.   Yes Historical Provider, MD  amoxicillin (AMOXIL) 875 MG tablet Take 1 tablet (875 mg total) by mouth 2 (two) times daily. 05/03/15   Kandra Nicolas, MD  benzonatate (TESSALON) 200 MG capsule Take 1 capsule (200 mg total) by mouth at bedtime. Take as needed for cough 05/03/15   Kandra Nicolas, MD  Eflornithine HCl 13.9 % cream Apply 13.9 % topically 2 (two) times daily with a meal.    Historical Provider, MD  levocetirizine (XYZAL) 5 MG tablet Take 5 mg by mouth every evening.    Historical Provider, MD  tretinoin (RETIN-A) 0.1 % cream Apply topically 2 (two) times daily.    Historical Provider, MD   Meds Ordered and Administered this Visit   Medications  triamcinolone acetonide (KENALOG-40) injection 40 mg (40 mg Intramuscular Given 05/03/15 1018)    BP 116/80 mmHg  Pulse 100  Temp(Src) 98.1 F (36.7 C) (Oral)  Resp 16  Wt 150 lb (68.04 kg)  SpO2 99% No data found.  Physical Exam Nursing notes and Vital Signs reviewed. Appearance:  Patient appears stated age, and in no acute distress Eyes:  Pupils are equal, round, and reactive to light and accomodation.  Extraocular movement is intact.  Conjunctivae are not inflamed  Ears:  Canals normal.  Tympanic membranes normal.  Nose:  Congested turbinates.   Maxillary sinus tenderness is present.  Pharynx:  Normal Neck:  Supple.  Nontender enlarged posterior/lateral nodes are palpated bilaterally  Lungs:  Clear to auscultation.  Breath sounds are equal.  Moving air well. Heart:  Regular rate and rhythm without murmurs, rubs, or gallops.  Abdomen:  Nontender without masses or hepatosplenomegaly.  Bowel  sounds are present.  No CVA or flank tenderness.  Extremities:  No edema.  Skin:  No rash present.   ED Course  Procedures none   MDM   1. Viral URI with cough   2. Acute recurrent maxillary sinusitis    Administered Kenalog 40mg  IM Begin amoxicillin 875mg  BID.  Prescription written for Benzonatate [facility]) to take at bedtime for night-time cough.  Continue Mucinex D, with plenty of water, for cough and congestion.  Get adequate rest.   May use Afrin nasal spray (or generic oxymetazoline) twice daily for about 5 days and then discontinue.  Also recommend using saline nasal spray several times daily and saline nasal irrigation (AYR is a common brand).  Use Flonase nasal spray each morning after using Afrin nasal spray and saline nasal irrigation. Continue Singulair. Try warm salt water gargles for sore throat.  Stop all antihistamines for now, and other non-prescription cough/cold preparations.  Follow-up with family doctor if not improving about one week.    Kandra Nicolas, MD 05/03/15 1022

## 2015-05-03 NOTE — ED Notes (Signed)
Pt c/o 2 weeks of cough, congestion and scratchy throat. Afebrile. Taken Aleve.

## 2015-06-19 ENCOUNTER — Ambulatory Visit (INDEPENDENT_AMBULATORY_CARE_PROVIDER_SITE_OTHER): Payer: 59 | Admitting: Family Medicine

## 2015-06-19 ENCOUNTER — Encounter: Payer: Self-pay | Admitting: Family Medicine

## 2015-06-19 VITALS — BP 124/85 | HR 105 | Ht 64.0 in | Wt 150.0 lb

## 2015-06-19 DIAGNOSIS — R05 Cough: Secondary | ICD-10-CM | POA: Diagnosis not present

## 2015-06-19 DIAGNOSIS — Z0000 Encounter for general adult medical examination without abnormal findings: Secondary | ICD-10-CM

## 2015-06-19 DIAGNOSIS — R1030 Lower abdominal pain, unspecified: Secondary | ICD-10-CM | POA: Diagnosis not present

## 2015-06-19 DIAGNOSIS — Z23 Encounter for immunization: Secondary | ICD-10-CM | POA: Diagnosis not present

## 2015-06-19 DIAGNOSIS — R059 Cough, unspecified: Secondary | ICD-10-CM

## 2015-06-19 DIAGNOSIS — R1032 Left lower quadrant pain: Secondary | ICD-10-CM

## 2015-06-19 NOTE — Progress Notes (Signed)
CC: Jean Kemp is a 45 y.o. 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  She is requesting a complete physical exam with a few concerns. 3 times this year now she's had an episode where she takes Aleve for headache and the next morning she'll be struggling with nasal congestion, sore throat and a nonproductive cough. Usually goes away with steroids and an antibiotic. It happened most recently late last week but she's not sought out help yet. She's had described a persistent nonproductive cough without any wheezing or shortness of breath. Additionally her sore throat is present but not responding to Singulair, nasal saline washes nor a nasal steroid spray. She denies fevers, chills nor angioedema. Denies rashes  Interestingly over a decade ago she had similar reactions to ibuprofen.   Review of Systems - General ROS: negative for - chills, fever, night sweats, weight gain or weight loss Ophthalmic ROS: negative for - decreased vision Psychological ROS: negative for - anxiety or depression ENT ROS: negative for - hearing change, Hematological and Lymphatic ROS: negative for - bleeding problems, bruising or swollen lymph nodes Breast ROS: negative Respiratory ROS: no shortness of breath, or wheezing Cardiovascular ROS: no chest pain or dyspnea on exertion Gastrointestinal ROS: no abdominal pain, change in bowel habits, or black or bloody stools Genito-Urinary ROS: negative for - genital discharge, genital ulcers, incontinence or abnormal bleeding from genitals Musculoskeletal ROS: negative for - joint pain or muscle pain Neurological ROS: negative for - headaches or memory loss Dermatological ROS: negative for lumps,  mole changes, rash and skin lesion changes  Past Medical History  Diagnosis Date  . Multiple allergies   . Asthma   . Hypertension   . GERD (gastroesophageal reflux disease)   . Heart murmur     mvp-never had echo    Past Surgical History  Procedure Laterality Date  . Nissen fundoplication    . Hiatal hernia repair    . Abdominal hysterectomy    . Upper gi endoscopy    . Colonoscopy    . Tonsillectomy    . Septoplasty N/A 05/16/2014    Procedure: SEPTOPLASTY;  Surgeon: Leta Baptist, MD;  Location: [facility];  Service: ENT;  Laterality: N/A;  . Turbinate reduction Bilateral 05/16/2014    Procedure: BILATERAL TURBINATE REDUCTION;  Surgeon: Leta Baptist, MD;  Location: [facility];  Service: ENT;  Laterality: Bilateral;   History reviewed. No pertinent family history.  Social History   Social History  . Marital Status: Married    Spouse Name: N/A  . Number of Children: N/A  . Years of Education: N/A   Occupational History  . Not on file.   Social History Main Topics  . Smoking status: Never Smoker   . Smokeless tobacco: Not on file  . Alcohol Use: Yes     Comment: occ  . Drug Use: No  . Sexual Activity: Not on file   Other Topics Concern  . Not on file   Social History Narrative     Objective: BP 124/85 mmHg  Pulse 105  Ht 5\' 4"  (1.626 m)  Wt 150 lb (68.04 kg)  BMI 25.73 kg/m2  General: No Acute Distress HEENT: Atraumatic, normocephalic, conjunctivae normal without scleral icterus.  No  nasal discharge, hearing grossly intact, TMs with good landmarks bilaterally with no middle ear abnormalities, posterior pharynx clear without oral lesions. Neck: Supple, trachea midline, no cervical nor supraclavicular adenopathy. Pulmonary: Clear to auscultation bilaterally without wheezing, rhonchi, nor rales. Cardiac: Regular rate and rhythm.  No murmurs, rubs, nor gallops. No peripheral edema.  2+ peripheral pulses bilaterally. Abdomen: Bowel sounds  normal.  No masses.  Non-tender without rebound.  Negative Murphy's sign. MSK: Grossly intact, no signs of weakness.  Full strength throughout upper and lower extremities.  Full ROM in upper and lower extremities.  No midline spinal tenderness. Neuro: Gait unremarkable, CN II-XII grossly intact.  C5-C6 Reflex 2/4 Bilaterally, L4 Reflex 2/4 Bilaterally.  Cerebellar function intact. Skin: No rashes. Psych: Alert and oriented to person/place/time.  Thought process normal. No anxiety/depression.   Assessment & Plan: Jean Kemp was seen today for establish care.  Diagnoses and all orders for this visit:  Annual physical exam -     CBC w/Diff/Platelet -     COMPLETE METABOLIC PANEL WITH GFR -     Lipid panel  Left groin pain -     Ambulatory referral to Orthopedic Surgery  Cough   Healthy lifestyle interventions including but not limited to regular exercise, a healthy low fat diet, moderation of salt intake, the dangers of tobacco/alcohol/recreational drug use, nutrition supplementation, and accident avoidance were discussed with the patient and a handout was provided for future reference. She is requesting a referral to surgery for chronic left groin pain Obtaining white blood cell count differential and if eosinophils are elevated will offer her another injection of Kenalog 40 mg IM   Return if symptoms worsen or fail to improve.

## 2015-06-20 ENCOUNTER — Telehealth: Payer: Self-pay | Admitting: Family Medicine

## 2015-06-20 DIAGNOSIS — R739 Hyperglycemia, unspecified: Secondary | ICD-10-CM

## 2015-06-20 LAB — COMPLETE METABOLIC PANEL WITH GFR
ALK PHOS: 91 U/L (ref 33–115)
ALT: 16 U/L (ref 6–29)
AST: 17 U/L (ref 10–35)
Albumin: 4.6 g/dL (ref 3.6–5.1)
BUN: 10 mg/dL (ref 7–25)
CHLORIDE: 97 mmol/L — AB (ref 98–110)
CO2: 28 mmol/L (ref 20–31)
Calcium: 9.4 mg/dL (ref 8.6–10.2)
Creat: 0.88 mg/dL (ref 0.50–1.10)
GFR, EST NON AFRICAN AMERICAN: 78 mL/min (ref >=60)
GFR, Est African American: >89 mL/min (ref >=60)
GLUCOSE: 126 mg/dL — AB (ref 65–99)
POTASSIUM: 4.2 mmol/L (ref 3.5–5.3)
SODIUM: 135 mmol/L (ref 135–146)
Total Bilirubin: 0.5 mg/dL (ref 0.2–1.2)
Total Protein: 7.4 g/dL (ref 6.1–8.1)

## 2015-06-20 LAB — CBC WITH DIFFERENTIAL/PLATELET
Basophils Absolute: 0 cells/uL (ref 0–200)
Basophils Relative: 0 %
EOS ABS: 252 {cells}/uL (ref 15–500)
EOS PCT: 4 %
HCT: 41.7 % (ref 35.0–45.0)
Hemoglobin: 14.4 g/dL (ref 11.7–15.5)
LYMPHS ABS: 1512 {cells}/uL (ref 850–3900)
Lymphocytes Relative: 24 %
MCH: 31.9 pg (ref 27.0–33.0)
MCHC: 34.5 g/dL (ref 32.0–36.0)
MCV: 92.5 fL (ref 80.0–100.0)
MONO ABS: 567 {cells}/uL (ref 200–950)
MONOS PCT: 9 %
MPV: 9.2 fL (ref 7.5–12.5)
NEUTROS PCT: 63 %
Neutro Abs: 3969 cells/uL (ref 1500–7800)
PLATELETS: 463 10*3/uL — AB (ref 140–400)
RBC: 4.51 MIL/uL (ref 3.80–5.10)
RDW: 13.2 % (ref 11.0–15.0)
WBC: 6.3 10*3/uL (ref 3.8–10.8)

## 2015-06-20 LAB — LIPID PANEL
CHOL/HDL RATIO: 3 ratio (ref <=5.0)
Cholesterol: 228 mg/dL — ABNORMAL HIGH (ref 125–200)
HDL: 77 mg/dL (ref >=46)
LDL CALC: 129 mg/dL (ref <130)
Triglycerides: 112 mg/dL (ref <150)
VLDL: 22 mg/dL (ref <30)

## 2015-06-20 NOTE — Telephone Encounter (Signed)
Pt notified;transferred up front to schedule a nurse visit

## 2015-06-20 NOTE — Telephone Encounter (Signed)
Will you please let patient know that her liver function, kidney function, LDL cholesterol and blood cell counts were all normal.  Her blood sugar was mildly elevated again and I'd recommend she have a three month average blood sugar checked.  I've printed off a lab slip just in case the lab is unable to add this on to her blood from yesterday.

## 2015-06-20 NOTE — Telephone Encounter (Signed)
Also If she'd like to schedule a nurse visit for a 40mg  kenalog IM injection I think it would probably help her respiratory symptoms.

## 2015-06-21 ENCOUNTER — Ambulatory Visit (INDEPENDENT_AMBULATORY_CARE_PROVIDER_SITE_OTHER): Payer: 59 | Admitting: Sports Medicine

## 2015-06-21 VITALS — BP 132/75 | HR 105

## 2015-06-21 DIAGNOSIS — R0602 Shortness of breath: Secondary | ICD-10-CM

## 2015-06-21 LAB — HEMOGLOBIN A1C
HEMOGLOBIN A1C: 5.7 % — AB (ref <5.7)
Mean Plasma Glucose: 117 mg/dL

## 2015-06-21 MED ORDER — TRIAMCINOLONE ACETONIDE 40 MG/ML IJ SUSP
40.0000 mg | Freq: Once | INTRAMUSCULAR | Status: AC
  Administered 2015-06-21: 40 mg via INTRAMUSCULAR

## 2015-06-21 NOTE — Progress Notes (Signed)
   Subjective:    Patient ID: Jean Kemp, female    DOB: 1970-04-25, 45 y.o.   MRN: [id number] Pt in this afternoon for a shot of kenalog 40 for some chest tightness & shortness of breath.  Given without complications in [facility].  Beatris Ship, CMA HPI    Review of Systems     Objective:   Physical Exam        Assessment & Plan:

## 2015-06-22 ENCOUNTER — Encounter: Payer: Self-pay | Admitting: Family Medicine

## 2015-06-22 DIAGNOSIS — A6000 Herpesviral infection of urogenital system, unspecified: Secondary | ICD-10-CM | POA: Insufficient documentation

## 2015-06-27 ENCOUNTER — Encounter: Payer: Self-pay | Admitting: Family Medicine

## 2015-07-06 ENCOUNTER — Encounter: Payer: Self-pay | Admitting: Family Medicine

## 2015-08-20 ENCOUNTER — Encounter: Payer: Self-pay | Admitting: Family Medicine

## 2016-01-07 ENCOUNTER — Ambulatory Visit (INDEPENDENT_AMBULATORY_CARE_PROVIDER_SITE_OTHER): Payer: 59 | Admitting: Family Medicine

## 2016-01-07 VITALS — BP 120/70 | HR 109 | Temp 98.1°F | Wt 140.0 lb

## 2016-01-07 DIAGNOSIS — J320 Chronic maxillary sinusitis: Secondary | ICD-10-CM

## 2016-01-07 MED ORDER — IPRATROPIUM BROMIDE 0.06 % NA SOLN: 2.0000 | NASAL | 6 refills | Status: DC | PRN

## 2016-01-07 NOTE — Patient Instructions (Signed)
Thank you for coming in today. Use the atrovent.  We will give a steroid shot today.  We will also refer to ENT.

## 2016-01-07 NOTE — Progress Notes (Signed)
Jean Kemp is a 45 y.o. female who presents to [facility]: [place] today for sinus congestion.  For the past few weeks, she has had worsening congestion, facial pressure, and yellow/green nasal discharge. No fever, cough, nausea, vomiting, diarrhea. She has had chronic sinus congestion for years and says her baseline congestion worsens whenever she gets an infection.   Previously had a deviated nasal septum corrected, which helped her breathing but not sinus congestion. She has also had extensive allergy testing that has been negative. She takes mucinex, xyzal, and normal saline rinses daily for her sinuses. This is the 5th time her sinuses have acutely worsened this year. Previous episodes have been treated with antibiotics and/or steroids.  Past Medical History:  Diagnosis Date  . Asthma   . GERD (gastroesophageal reflux disease)   . Heart murmur    mvp-never had echo  . Hypertension   . Multiple allergies    Past Surgical History:  Procedure Laterality Date  . ABDOMINAL HYSTERECTOMY    . COLONOSCOPY    . HIATAL HERNIA REPAIR    . NISSEN FUNDOPLICATION    . SEPTOPLASTY N/A 05/16/2014   Procedure: SEPTOPLASTY;  Surgeon: Leta Baptist, MD;  Location: [facility];  Service: ENT;  Laterality: N/A;  . TONSILLECTOMY    . TURBINATE REDUCTION Bilateral 05/16/2014   Procedure: BILATERAL TURBINATE REDUCTION;  Surgeon: Leta Baptist, MD;  Location: [facility];  Service: ENT;  Laterality: Bilateral;  . UPPER GI ENDOSCOPY     Social History  Substance Use Topics  . Smoking status: Never Smoker  . Smokeless tobacco: Not on file  . Alcohol use Yes     Comment: occ   family history is not on file.  ROS as above:  Medications: Current Outpatient Prescriptions  Medication Sig Dispense Refill  . Dextromethorphan-Guaifenesin (MUCINEX DM MAXIMUM STRENGTH  PO) Take by mouth.    . Eflornithine HCl 13.9 % cream Apply 13.9 % topically 2 (two) times daily with a meal.    . levocetirizine (XYZAL) 5 MG tablet Take 5 mg by mouth every evening.    . montelukast (SINGULAIR) 10 MG tablet Take 10 mg by mouth at bedtime.    Marland Kitchen spironolactone (ALDACTONE) 100 MG tablet Take 100 mg by mouth daily.    Marland Kitchen tretinoin (RETIN-A) 0.1 % cream Apply topically 2 (two) times daily.    Marland Kitchen ipratropium (ATROVENT) 0.06 % nasal spray Place 2 sprays into both nostrils every 4 (four) hours as needed for rhinitis. 10 mL 6   No current facility-administered medications for this visit.    Allergies  Allergen Reactions  . Asa [Aspirin] Shortness Of Breath    wheeze  . Ibuprofen Shortness Of Breath    Wheezing   . Sulfa Antibiotics Hives    Health Maintenance Health Maintenance  Topic Date Due  . HIV Screening  04/01/1983  . PAP SMEAR  03/31/1989  . INFLUENZA VACCINE  08/28/2015  . MAMMOGRAM  01/02/2017  . TETANUS/TDAP  06/18/2025     Exam:  BP 120/70   Pulse (!) 109   Temp 98.1 F (36.7 C) (Oral)   Wt 140 lb (63.5 kg)   SpO2 100%   BMI 24.03 kg/m  Gen: Well NAD HEENT: EOMI,  MMM, nasal turbinates Slightly inflamed bilaterally, frontal and maxillary sinuses nontender to palpation, no cervical or supraclavicular lymphadenopathy, oropharynx clear Lungs: Normal work of breathing. CTABL Heart: RRR no MRG Abd: NABS,  Soft. Nondistended, Nontender Exts: Brisk capillary refill, warm and well perfused.    No results found for this or any previous visit (from the past 72 hour(s)). No results found.    Assessment and Plan: 45 y.o. female with acute-on-chronic maxillary sinusitis, likely precipitated by viral infection.  - Gave steroid shot in office (patient preference over oral meds) - Atrovent for symptomatic treatment - ENT referral for chronic sinus congestion with multiple exacerbations in past year, possibly related to anatomic defect.  Orders Placed This  Encounter  Procedures  . Ambulatory referral to ENT    Referral Priority:   Routine    Referral Type:   Consultation    Referral Reason:   Specialty Services Required    Requested Specialty:   Otolaryngology    Number of Visits Requested:   1    Discussed warning signs or symptoms. Please see discharge instructions. Patient expresses understanding.

## 2016-02-07 ENCOUNTER — Encounter: Payer: Self-pay | Admitting: Family Medicine

## 2016-02-07 ENCOUNTER — Ambulatory Visit (INDEPENDENT_AMBULATORY_CARE_PROVIDER_SITE_OTHER): Payer: Managed Care, Other (non HMO) | Admitting: Family Medicine

## 2016-02-07 VITALS — BP 118/76 | HR 96 | Wt 138.0 lb

## 2016-02-07 DIAGNOSIS — R319 Hematuria, unspecified: Secondary | ICD-10-CM | POA: Diagnosis not present

## 2016-02-07 DIAGNOSIS — Z0000 Encounter for general adult medical examination without abnormal findings: Secondary | ICD-10-CM

## 2016-02-07 DIAGNOSIS — M545 Low back pain, unspecified: Secondary | ICD-10-CM | POA: Insufficient documentation

## 2016-02-07 DIAGNOSIS — R739 Hyperglycemia, unspecified: Secondary | ICD-10-CM | POA: Diagnosis not present

## 2016-02-07 LAB — CBC
HCT: 41.1 % (ref 35.0–45.0)
HEMOGLOBIN: 13.6 g/dL (ref 11.7–15.5)
MCH: 31.2 pg (ref 27.0–33.0)
MCHC: 33.1 g/dL (ref 32.0–36.0)
MCV: 94.3 fL (ref 80.0–100.0)
MPV: 9.5 fL (ref 7.5–12.5)
Platelets: 332 10*3/uL (ref 140–400)
RBC: 4.36 MIL/uL (ref 3.80–5.10)
RDW: 13.5 % (ref 11.0–15.0)
WBC: 5.3 10*3/uL (ref 3.8–10.8)

## 2016-02-07 LAB — POCT URINALYSIS DIPSTICK
Bilirubin, UA: 1.015
COLOR UA: NEGATIVE
Clarity, UA: NEGATIVE
Glucose, UA: NEGATIVE
Ketones, UA: NEGATIVE
Leukocytes, UA: NEGATIVE
Nitrite, UA: NEGATIVE
PROTEIN UA: NEGATIVE
SPEC GRAV UA: 1.015
UROBILINOGEN UA: 0.2
pH, UA: 6

## 2016-02-07 LAB — TSH: TSH: 1.77 m[IU]/L

## 2016-02-07 MED ORDER — TRETINOIN 0.1 % EX CREA: TOPICAL_CREAM | Freq: Two times a day (BID) | CUTANEOUS | 12 refills | Status: DC

## 2016-02-07 MED ORDER — EFLORNITHINE HCL 13.9 % EX CREA: 1.0000 "application " | TOPICAL_CREAM | Freq: Two times a day (BID) | CUTANEOUS | 2 refills | Status: DC

## 2016-02-07 MED ORDER — SPIRONOLACTONE 100 MG PO TABS: 100.0000 mg | ORAL_TABLET | Freq: Every day | ORAL | 3 refills | Status: DC

## 2016-02-07 MED ORDER — CEPHALEXIN 500 MG PO CAPS: 500.0000 mg | ORAL_CAPSULE | Freq: Two times a day (BID) | ORAL | 0 refills | Status: DC

## 2016-02-07 NOTE — Progress Notes (Signed)
Jean Kemp is a 46 y.o. female who presents to [facility]: [place] [facility] today for well adult. Patient is here today for a wellness visit. She notes back pain but otherwise denies any medical problems. She denies fevers chills nausea vomiting or diarrhea. She takes the medications listed below and feels well. She exercises regularly. She denies any depressive symptoms.  Back pain: The current episode of back pain has been present for 3 days. She has she's had similar pain in the past with urinary tract infections although she denies urinary frequency urgency or dysuria. She notes the pain does not radiate and is moderate. The pain occurs worse when laying at night. The pain does interfere with her sleep. She cannot exacerbate the pain with palpation or motion. She's tried some over-the-counter Tylenol which helped but.   Past Medical History:  Diagnosis Date  . Asthma   . GERD (gastroesophageal reflux disease)   . Heart murmur    mvp-never had echo  . Hypertension   . Multiple allergies    Past Surgical History:  Procedure Laterality Date  . ABDOMINAL HYSTERECTOMY    . COLONOSCOPY    . HIATAL HERNIA REPAIR    . NISSEN FUNDOPLICATION    . SEPTOPLASTY N/A 05/16/2014   Procedure: SEPTOPLASTY;  Surgeon: Leta Baptist, MD;  Location: [facility];  Service: ENT;  Laterality: N/A;  . TONSILLECTOMY    . TURBINATE REDUCTION Bilateral 05/16/2014   Procedure: BILATERAL TURBINATE REDUCTION;  Surgeon: Leta Baptist, MD;  Location: [facility];  Service: ENT;  Laterality: Bilateral;  . UPPER GI ENDOSCOPY     Social History  Substance Use Topics  . Smoking status: Never Smoker  . Smokeless tobacco: Not on file  . Alcohol use Yes     Comment: occ   family history is not on file.  ROS as above:  Medications: Current Outpatient Prescriptions  Medication Sig  Dispense Refill  . Dextromethorphan-Guaifenesin (MUCINEX DM MAXIMUM STRENGTH PO) Take by mouth.    . Eflornithine HCl 13.9 % cream Apply 1 application topically 2 (two) times daily with a meal. 45 g 2  . ipratropium (ATROVENT) 0.06 % nasal spray Place 2 sprays into both nostrils every 4 (four) hours as needed for rhinitis. 10 mL 6  . levocetirizine (XYZAL) 5 MG tablet Take 5 mg by mouth every evening.    . montelukast (SINGULAIR) 10 MG tablet Take 10 mg by mouth at bedtime.    Marland Kitchen spironolactone (ALDACTONE) 100 MG tablet Take 1 tablet (100 mg total) by mouth daily. 90 tablet 3  . tretinoin (RETIN-A) 0.1 % cream Apply topically 2 (two) times daily. 45 g 12  . cephALEXin (KEFLEX) 500 MG capsule Take 1 capsule (500 mg total) by mouth 2 (two) times daily. 14 capsule 0   No current facility-administered medications for this visit.    Allergies  Allergen Reactions  . Asa [Aspirin] Shortness Of Breath    wheeze  . Ibuprofen Shortness Of Breath    Wheezing   . Sulfa Antibiotics Hives    Health Maintenance Health Maintenance  Topic Date Due  . HIV Screening  04/01/1983  . PAP SMEAR  03/31/1989  . INFLUENZA VACCINE  08/28/2015  . MAMMOGRAM  01/02/2017  . TETANUS/TDAP  06/18/2025     Exam:  BP 118/76   Pulse 96   Wt 138 lb (62.6 kg)   BMI 23.69 kg/m  Gen: Well NAD HEENT:  EOMI,  MMM Lungs: Normal work of breathing. CTABL Heart: RRR no MRG Abd: NABS, Soft. Nondistended, Nontender No CV angle tenderness to percussion Exts: Brisk capillary refill, warm and well perfused.  L-spine: Nontender to spinal midline. Nontender paraspinal muscles. Normal back motion without pain. Negative Faber test and straight-leg raise test bilaterally. Normal motion.  Point-of-care urinalysis significant for trace blood  Results for orders placed or performed in visit on 02/07/16 (from the past 72 hour(s))  POCT Urinalysis Dipstick     Status: None   Collection Time: 02/07/16 11:28 AM  Result Value Ref  Range   Color, UA neg    Clarity, UA neg    Glucose, UA neg    Bilirubin, UA 1.015    Ketones, UA neg    Spec Grav, UA 1.015    Blood, UA trace lysed    pH, UA 6.0    Protein, UA neg    Urobilinogen, UA 0.2    Nitrite, UA neg    Leukocytes, UA Negative Negative   No results found.    Assessment and Plan: 46 y.o. female with well adult visit. Patient overall is doing quite well. Her blood pressure is well controlled. Plan to recheck lipids and A1c as patient has a history of elevation of both. We'll also check kidney function and CBC on spironolactone.  Patient has acute intermittent back pain. The etiology is somewhat unclear. Her symptoms are somewhat consistent with urinary tract infection. The point-of-care urinalysis is significant for trace blood. We'll send for urine microscopy and culture and treat empirically with Keflex. Additionally we'll broaden workup to include TSH CK and sedimentation rate to evaluate for potential rheumatologic causes of back pain. If pain does not resolve spontaneously or with antibiotics will return and proceed with imaging studies   Orders Placed This Encounter  Procedures  . Urine culture  . CBC  . COMPLETE METABOLIC PANEL WITH GFR  . Hemoglobin A1c  . TSH  . CK  . Urinalysis, Routine w reflex microscopic  . Sed Rate (ESR)  . POCT Urinalysis Dipstick    Discussed warning signs or symptoms. Please see discharge instructions. Patient expresses understanding.

## 2016-02-07 NOTE — Patient Instructions (Signed)
Thank you for coming in today. Take keflex twice daily.  Get labs today.  Let me know if you do not get better.  Come back or go to the emergency room if you notice new weakness new numbness problems walking or bowel or bladder problems.

## 2016-02-08 ENCOUNTER — Encounter: Payer: Self-pay | Admitting: Family Medicine

## 2016-02-08 LAB — HEMOGLOBIN A1C
Hgb A1c MFr Bld: 5.5 % (ref <5.7)
Mean Plasma Glucose: 111 mg/dL

## 2016-02-08 LAB — SEDIMENTATION RATE: SED RATE: 6 mm/h (ref 0–20)

## 2016-02-08 LAB — CK: Total CK: 84 U/L (ref 7–177)

## 2016-02-08 NOTE — Telephone Encounter (Signed)
Called Pt and went over questions. Advised to continue taking antibiotic until urine culture results are available. Also advised not to double up on aleve, she can take some tylenol in between if needed.

## 2016-02-09 LAB — COMPLETE METABOLIC PANEL WITH GFR
ALBUMIN: 4 g/dL (ref 3.6–5.1)
ALK PHOS: 73 U/L (ref 33–115)
ALT: 16 U/L (ref 6–29)
AST: 15 U/L (ref 10–35)
BILIRUBIN TOTAL: 0.5 mg/dL (ref 0.2–1.2)
BUN: 8 mg/dL (ref 7–25)
CO2: 26 mmol/L (ref 20–31)
Calcium: 9.3 mg/dL (ref 8.6–10.2)
Chloride: 102 mmol/L (ref 98–110)
Creat: 0.77 mg/dL (ref 0.50–1.10)
GFR, Est African American: >89 mL/min (ref >=60)
GFR, Est Non African American: >89 mL/min (ref >=60)
GLUCOSE: 92 mg/dL (ref 65–99)
Potassium: 4.2 mmol/L (ref 3.5–5.3)
SODIUM: 136 mmol/L (ref 135–146)
TOTAL PROTEIN: 7 g/dL (ref 6.1–8.1)

## 2016-02-09 LAB — URINALYSIS, ROUTINE W REFLEX MICROSCOPIC
BILIRUBIN URINE: NEGATIVE
Glucose, UA: NEGATIVE
Hgb urine dipstick: NEGATIVE
KETONES UR: NEGATIVE
Leukocytes, UA: NEGATIVE
Nitrite: NEGATIVE
PROTEIN: NEGATIVE
Specific Gravity, Urine: 1.017 (ref 1.001–1.035)
pH: 6 (ref 5.0–8.0)

## 2016-02-10 LAB — URINE CULTURE

## 2016-04-11 ENCOUNTER — Ambulatory Visit (INDEPENDENT_AMBULATORY_CARE_PROVIDER_SITE_OTHER): Payer: Managed Care, Other (non HMO) | Admitting: Sports Medicine

## 2016-04-11 ENCOUNTER — Ambulatory Visit (INDEPENDENT_AMBULATORY_CARE_PROVIDER_SITE_OTHER): Payer: Managed Care, Other (non HMO)

## 2016-04-11 DIAGNOSIS — R519 Headache, unspecified: Secondary | ICD-10-CM | POA: Insufficient documentation

## 2016-04-11 DIAGNOSIS — M542 Cervicalgia: Secondary | ICD-10-CM

## 2016-04-11 DIAGNOSIS — R51 Headache: Secondary | ICD-10-CM

## 2016-04-11 LAB — BASIC METABOLIC PANEL WITH GFR
CO2: 26 mmol/L (ref 20–31)
Chloride: 101 mmol/L (ref 98–110)
Glucose, Bld: 94 mg/dL (ref 65–99)
Potassium: 4.5 mmol/L (ref 3.5–5.3)
Sodium: 137 mmol/L (ref 135–146)

## 2016-04-11 LAB — BASIC METABOLIC PANEL
BUN: 11 mg/dL (ref 7–25)
Calcium: 9.6 mg/dL (ref 8.6–10.2)
Creat: 0.74 mg/dL (ref 0.50–1.10)

## 2016-04-11 MED ORDER — PREDNISONE 50 MG PO TABS: ORAL_TABLET | ORAL | 0 refills | Status: DC

## 2016-04-11 MED ORDER — AZITHROMYCIN 250 MG PO TABS: ORAL_TABLET | ORAL | 0 refills | Status: DC

## 2016-04-11 NOTE — Assessment & Plan Note (Signed)
Unclear etiology, some components are consistent with a frontal sinusitis, also has some symptoms consistent with cervical cephalalgia. She does have chronic sinus infections and has a balloon sinuplasty coming up. Adding prednisone, azithromycin. I would also like a cervical spine x-ray, she'll return in a week and if persistent headaches we will get an MRI of the brain with and without contrast, checking renal function today.

## 2016-04-11 NOTE — Progress Notes (Signed)
  Subjective:    CC: Headache/neck pain  HPI: Jean Kemp is a pleasant 46 year old female, for the past 2 weeks she's had pain that she localizes in the front of her head, without radiation, it is positional and sometimes dull, sometimes throbbing. No nausea, no photophobia, no phonophobia. She does have a history of chronic sinus infections and does have a balloon sinuplasty coming up next week.    In addition she has pain in her neck with radiation down the arm to the deltoid but not past. No numbness or tingling in the hands, fingers, no constitutional symptoms, no trauma.  Past medical history:  Negative.  See flowsheet/record as well for more information.  Surgical history: Negative.  See flowsheet/record as well for more information.  Family history: Negative.  See flowsheet/record as well for more information.  Social history: Negative.  See flowsheet/record as well for more information.  Allergies, and medications have been entered into the medical record, reviewed, and no changes needed.   Review of Systems: No fevers, chills, night sweats, weight loss, chest pain, or shortness of breath.   Objective:    General: Well Developed, well nourished, and in no acute distress.  Neuro: Alert and oriented x3, extra-ocular muscles intact, sensation grossly intact. Cranial nerves II through XII are intact, motor, sensory, coordinative functions are all intact. HEENT: Normocephalic, atraumatic, pupils equal round reactive to light, neck supple, no masses, no lymphadenopathy, thyroid nonpalpable. Oropharynx, nasopharynx, ear canals unremarkable. Skin: Warm and dry, no rashes. Cardiac: Regular rate and rhythm, no murmurs rubs or gallops, no lower extremity edema.  Respiratory: Clear to auscultation bilaterally. Not using accessory muscles, speaking in full sentences. Neck: Negative spurling's Full neck range of motion Grip strength and sensation normal in bilateral hands Strength good C4 to T1  distribution No sensory change to C4 to T1 Reflexes normal  Cervical spine x-rays reviewed, there is retrolisthesis of C5 on C6 and multilevel degenerative disc disease.  Impression and Recommendations:    Headache Unclear etiology, some components are consistent with a frontal sinusitis, also has some symptoms consistent with cervical cephalalgia. She does have chronic sinus infections and has a balloon sinuplasty coming up. Adding prednisone, azithromycin. I would also like a cervical spine x-ray, she'll return in a week and if persistent headaches we will get an MRI of the brain with and without contrast, checking renal function today.

## 2016-04-14 MED ORDER — PREDNISONE 10 MG (48) PO TBPK: ORAL_TABLET | Freq: Every day | ORAL | 0 refills | Status: DC

## 2016-04-14 NOTE — Addendum Note (Signed)
Addended by: Silverio Decamp on: 04/14/2016 12:10 PM   Modules accepted: Orders

## 2016-04-16 ENCOUNTER — Encounter: Payer: Self-pay | Admitting: Family Medicine

## 2016-04-16 ENCOUNTER — Encounter: Payer: Self-pay | Admitting: Sports Medicine

## 2016-04-17 MED ORDER — CYCLOBENZAPRINE HCL 10 MG PO TABS: ORAL_TABLET | ORAL | 0 refills | Status: DC

## 2016-04-21 ENCOUNTER — Ambulatory Visit (INDEPENDENT_AMBULATORY_CARE_PROVIDER_SITE_OTHER): Payer: Managed Care, Other (non HMO)

## 2016-04-21 ENCOUNTER — Ambulatory Visit (INDEPENDENT_AMBULATORY_CARE_PROVIDER_SITE_OTHER): Payer: Managed Care, Other (non HMO) | Admitting: Sports Medicine

## 2016-04-21 DIAGNOSIS — G4489 Other headache syndrome: Secondary | ICD-10-CM | POA: Diagnosis not present

## 2016-04-21 DIAGNOSIS — R51 Headache: Secondary | ICD-10-CM

## 2016-04-21 DIAGNOSIS — R519 Headache, unspecified: Secondary | ICD-10-CM

## 2016-04-21 MED ORDER — GADOBENATE DIMEGLUMINE 529 MG/ML IV SOLN
12.0000 mL | Freq: Once | INTRAVENOUS | Status: AC | PRN
Start: 2016-04-21 — End: 2016-04-21
  Administered 2016-04-21: 12 mL via INTRAVENOUS

## 2016-04-21 MED ORDER — HYDROCODONE-ACETAMINOPHEN 5-325 MG PO TABS: 1.0000 | ORAL_TABLET | Freq: Three times a day (TID) | ORAL | 0 refills | Status: DC | PRN

## 2016-04-21 NOTE — Assessment & Plan Note (Signed)
Persistent intractable headache now for many months. Certainly considering her recent treatment with prednisone and azithromycin as well as Flexeril which should have improved with a sinus headache and/or cervical cephalalgia and lack of response I am concerned about an intracranial lesion. Certainly pseudotumor cerebri is on the differential. At this point we are going to proceed with brain MRI with and without contrast, she does have recent kidney function which is normal. She also has her balloon sinuplasty coming up in a week. Small amount of hydrocodone given for pain.

## 2016-04-21 NOTE — Progress Notes (Signed)
  Subjective:    CC: Follow-up  HPI: Jean Kemp returns, she had persistent headaches, frontal, severe, she didn't describe many migrainous-type symptoms. She's never had advanced brain imaging. She does have known chronic sinusitis and does have a balloon sinus procedure coming up next week. Neck x-rays were negative, and unfortunately she did not improvement prednisone, Flexeril. No visual changes, no chest pain, no focal neurologic symptoms.  Past medical history:  Negative.  See flowsheet/record as well for more information.  Surgical history: Negative.  See flowsheet/record as well for more information.  Family history: Negative.  See flowsheet/record as well for more information.  Social history: Negative.  See flowsheet/record as well for more information.  Allergies, and medications have been entered into the medical record, reviewed, and no changes needed.   Review of Systems: No fevers, chills, night sweats, weight loss, chest pain, or shortness of breath.   Objective:    General: Well Developed, well nourished, and in no acute distress.  Neuro: Alert and oriented x3, extra-ocular muscles intact, sensation grossly intact.  HEENT: Normocephalic, atraumatic, pupils equal round reactive to light, neck supple, no masses, no lymphadenopathy, thyroid nonpalpable.  Skin: Warm and dry, no rashes. Cardiac: Regular rate and rhythm, no murmurs rubs or gallops, no lower extremity edema.  Respiratory: Clear to auscultation bilaterally. Not using accessory muscles, speaking in full sentences.  Impression and Recommendations:    Headache Persistent intractable headache now for many months. Certainly considering her recent treatment with prednisone and azithromycin as well as Flexeril which should have improved with a sinus headache and/or cervical cephalalgia and lack of response I am concerned about an intracranial lesion. Certainly pseudotumor cerebri is on the differential. At this point we  are going to proceed with brain MRI with and without contrast, she does have recent kidney function which is normal. She also has her balloon sinuplasty coming up in a week. Small amount of hydrocodone given for pain.  I spent 25 minutes with this patient, greater than 50% was face-to-face time counseling regarding the above diagnoses

## 2016-05-13 ENCOUNTER — Other Ambulatory Visit: Payer: Self-pay | Admitting: Sports Medicine

## 2017-02-06 ENCOUNTER — Encounter: Payer: Self-pay | Admitting: Family Medicine

## 2017-02-06 ENCOUNTER — Ambulatory Visit (INDEPENDENT_AMBULATORY_CARE_PROVIDER_SITE_OTHER): Payer: Managed Care, Other (non HMO) | Admitting: Family Medicine

## 2017-02-06 VITALS — BP 124/89 | HR 80 | Ht 64.0 in | Wt 134.0 lb

## 2017-02-06 DIAGNOSIS — R519 Headache, unspecified: Secondary | ICD-10-CM

## 2017-02-06 DIAGNOSIS — M545 Low back pain, unspecified: Secondary | ICD-10-CM

## 2017-02-06 DIAGNOSIS — Z0000 Encounter for general adult medical examination without abnormal findings: Secondary | ICD-10-CM

## 2017-02-06 DIAGNOSIS — R739 Hyperglycemia, unspecified: Secondary | ICD-10-CM

## 2017-02-06 DIAGNOSIS — R51 Headache: Secondary | ICD-10-CM | POA: Diagnosis not present

## 2017-02-06 DIAGNOSIS — R251 Tremor, unspecified: Secondary | ICD-10-CM | POA: Diagnosis not present

## 2017-02-06 LAB — POCT GLYCOSYLATED HEMOGLOBIN (HGB A1C): Hemoglobin A1C: 5.3

## 2017-02-06 MED ORDER — FLUCONAZOLE 100 MG PO TABS: 100.0000 mg | ORAL_TABLET | Freq: Every day | ORAL | 0 refills | Status: DC

## 2017-02-06 MED ORDER — SPIRONOLACTONE 100 MG PO TABS: 100.0000 mg | ORAL_TABLET | Freq: Every day | ORAL | 3 refills | Status: DC

## 2017-02-06 MED ORDER — TRETINOIN 0.1 % EX CREA: TOPICAL_CREAM | Freq: Two times a day (BID) | CUTANEOUS | 12 refills | Status: DC

## 2017-02-06 NOTE — Progress Notes (Signed)
Jean Kemp is a 47 y.o. female who presents to [facility]: [place] today for well adult visit.  [name] is doing well overall. She keeps track of her weight and calories. She does not exercise regularly.  She takes Spironolactone and Tertinoin for acne.   Her only vaccine problem is recurrent BV and this.  She has been to her OB/GYN multiple times.  She is done some reading and would like to try 1 week long course of fluconazole.  She is also interested in boric acid suppositories for BV.   Past Medical History:  Diagnosis Date  . Asthma   . GERD (gastroesophageal reflux disease)   . Heart murmur    mvp-never had echo  . Hypertension   . Multiple allergies    Past Surgical History:  Procedure Laterality Date  . ABDOMINAL HYSTERECTOMY    . COLONOSCOPY    . HIATAL HERNIA REPAIR    . NISSEN FUNDOPLICATION    . SEPTOPLASTY N/A 05/16/2014   Procedure: SEPTOPLASTY;  Surgeon: Leta Baptist, MD;  Location: [facility];  Service: ENT;  Laterality: N/A;  . TONSILLECTOMY    . TURBINATE REDUCTION Bilateral 05/16/2014   Procedure: BILATERAL TURBINATE REDUCTION;  Surgeon: Leta Baptist, MD;  Location: [facility];  Service: ENT;  Laterality: Bilateral;  . UPPER GI ENDOSCOPY     Social History   Tobacco Use  . Smoking status: Never Smoker  . Smokeless tobacco: Never Used  Substance Use Topics  . Alcohol use: Yes    Comment: occ   family history is not on file.  ROS as above:  Medications: Current Outpatient Medications  Medication Sig Dispense Refill  . Eflornithine HCl 13.9 % cream Apply 1 application topically 2 (two) times daily with a meal. 45 g 2  . levocetirizine (XYZAL) 5 MG tablet Take 5 mg by mouth every evening.    . montelukast (SINGULAIR) 10 MG tablet Take 10 mg by mouth at bedtime.    Marland Kitchen spironolactone (ALDACTONE) 100 MG tablet Take 1  tablet (100 mg total) by mouth daily. 90 tablet 3  . tretinoin (RETIN-A) 0.1 % cream Apply topically 2 (two) times daily. 45 g 12  . fluconazole (DIFLUCAN) 100 MG tablet Take 1 tablet (100 mg total) by mouth daily. 7 tablet 0   No current facility-administered medications for this visit.    Allergies  Allergen Reactions  . Asa [Aspirin] Shortness Of Breath    wheeze  . Ibuprofen Shortness Of Breath    Wheezing   . Sulfa Antibiotics Hives    Health Maintenance Health Maintenance  Topic Date Due  . HIV Screening  04/01/1983  . INFLUENZA VACCINE  10/09/2017 (Originally 08/27/2016)  . MAMMOGRAM  01/15/2019  . TETANUS/TDAP  06/18/2025     Exam:  BP 124/89   Pulse 80   Ht 5\' 4"  (1.626 m)   Wt 134 lb (60.8 kg)   BMI 23.00 kg/m  Gen: Well NAD HEENT: EOMI,  MMM Lungs: Normal work of breathing. CTABL Heart: RRR no MRG Abd: NABS, Soft. Nondistended, Nontender Exts: Brisk capillary refill, warm and well perfused.    Results for orders placed or performed in visit on 02/06/17 (from the past 72 hour(s))  POCT HgB A1C     Status: None   Collection Time: 02/06/17 10:22 AM  Result Value Ref Range   Hemoglobin A1C 5.3    No results found.    Assessment  and Plan: 47 y.o. female with  Well adult.  Doing extremely well.  I recommend adding exercise I think it is reasonable to try a week long course of fluconazole.  I also think it is reasonable to check some basic fasting labs as she is previously a problem screening mammograms. Recheck yearly   Orders Placed This Encounter  Procedures  . CBC  . COMPLETE METABOLIC PANEL WITH GFR  . Lipid Panel w/reflex Direct LDL  . VITAMIN D 25 Hydroxy (Vit-D Deficiency, Fractures)  . POCT HgB A1C   Meds ordered this encounter  Medications  . spironolactone (ALDACTONE) 100 MG tablet    Sig: Take 1 tablet (100 mg total) by mouth daily.    Dispense:  90 tablet    Refill:  3  . tretinoin (RETIN-A) 0.1 % cream    Sig: Apply topically 2  (two) times daily.    Dispense:  45 g    Refill:  12  . fluconazole (DIFLUCAN) 100 MG tablet    Sig: Take 1 tablet (100 mg total) by mouth daily.    Dispense:  7 tablet    Refill:  0     Discussed warning signs or symptoms. Please see discharge instructions. Patient expresses understanding.

## 2017-02-06 NOTE — Patient Instructions (Signed)
Thank you for coming in today. Continue current medications.  Consider boric acid suppositories for BV.  We can consider a week long course of fluconazole if you like.

## 2017-02-07 LAB — COMPLETE METABOLIC PANEL WITH GFR
AG RATIO: 1.7 (calc) (ref 1.0–2.5)
ALKALINE PHOSPHATASE (APISO): 86 U/L (ref 33–115)
ALT: 16 U/L (ref 6–29)
AST: 16 U/L (ref 10–35)
Albumin: 4.5 g/dL (ref 3.6–5.1)
BUN: 17 mg/dL (ref 7–25)
CALCIUM: 9.8 mg/dL (ref 8.6–10.2)
CHLORIDE: 102 mmol/L (ref 98–110)
CO2: 29 mmol/L (ref 20–32)
Creat: 0.93 mg/dL (ref 0.50–1.10)
GFR, EST NON AFRICAN AMERICAN: 73 mL/min/{1.73_m2} (ref >=60)
GFR, Est African American: 84 mL/min/{1.73_m2} (ref >=60)
GLOBULIN: 2.7 g/dL (ref 1.9–3.7)
Glucose, Bld: 91 mg/dL (ref 65–99)
POTASSIUM: 4.3 mmol/L (ref 3.5–5.3)
SODIUM: 139 mmol/L (ref 135–146)
Total Bilirubin: 0.6 mg/dL (ref 0.2–1.2)
Total Protein: 7.2 g/dL (ref 6.1–8.1)

## 2017-02-07 LAB — LIPID PANEL W/REFLEX DIRECT LDL
CHOLESTEROL: 244 mg/dL — AB (ref <200)
HDL: 91 mg/dL (ref >50)
LDL Cholesterol (Calc): 134 mg/dL (calc) — ABNORMAL HIGH
NON-HDL CHOLESTEROL (CALC): 153 mg/dL — AB (ref <130)
Total CHOL/HDL Ratio: 2.7 (calc) (ref <5.0)
Triglycerides: 91 mg/dL (ref <150)

## 2017-02-07 LAB — CBC
HCT: 41.4 % (ref 35.0–45.0)
Hemoglobin: 14.2 g/dL (ref 11.7–15.5)
MCH: 31.8 pg (ref 27.0–33.0)
MCHC: 34.3 g/dL (ref 32.0–36.0)
MCV: 92.8 fL (ref 80.0–100.0)
MPV: 9.9 fL (ref 7.5–12.5)
Platelets: 345 10*3/uL (ref 140–400)
RBC: 4.46 10*6/uL (ref 3.80–5.10)
RDW: 12.9 % (ref 11.0–15.0)
WBC: 4.1 10*3/uL (ref 3.8–10.8)

## 2017-02-07 LAB — VITAMIN D 25 HYDROXY (VIT D DEFICIENCY, FRACTURES): Vit D, 25-Hydroxy: 10 ng/mL — ABNORMAL LOW (ref 30–100)

## 2017-02-18 ENCOUNTER — Encounter: Payer: Self-pay | Admitting: Physician Assistant

## 2017-02-18 DIAGNOSIS — N898 Other specified noninflammatory disorders of vagina: Secondary | ICD-10-CM | POA: Insufficient documentation

## 2017-02-18 DIAGNOSIS — E559 Vitamin D deficiency, unspecified: Secondary | ICD-10-CM

## 2017-02-18 DIAGNOSIS — E663 Overweight: Secondary | ICD-10-CM | POA: Insufficient documentation

## 2017-02-18 DIAGNOSIS — E782 Mixed hyperlipidemia: Secondary | ICD-10-CM

## 2017-02-18 HISTORY — DX: Mixed hyperlipidemia: E78.2

## 2017-02-18 HISTORY — DX: Vitamin D deficiency, unspecified: E55.9

## 2017-02-18 NOTE — Progress Notes (Signed)
Good afternoon,  Your labs look good overall  - your vitamin D is low. Recommend D3 5000 units daily and recheck level in 3 months - total cholesterol is high. Recommend low-fat diet, at least 120 minutes of aerobic exercise, and maintaining a healthy body weight

## 2017-06-02 ENCOUNTER — Ambulatory Visit (INDEPENDENT_AMBULATORY_CARE_PROVIDER_SITE_OTHER): Payer: 59 | Admitting: Family Medicine

## 2017-06-02 ENCOUNTER — Encounter: Payer: Self-pay | Admitting: Family Medicine

## 2017-06-02 VITALS — BP 135/89 | HR 83 | Temp 98.1°F | Wt 134.7 lb

## 2017-06-02 DIAGNOSIS — J0101 Acute recurrent maxillary sinusitis: Secondary | ICD-10-CM

## 2017-06-02 MED ORDER — PREDNISONE 10 MG PO TABS: 30.0000 mg | ORAL_TABLET | Freq: Every day | ORAL | 0 refills | Status: DC

## 2017-06-02 MED ORDER — CEFDINIR 300 MG PO CAPS: 300.0000 mg | ORAL_CAPSULE | Freq: Two times a day (BID) | ORAL | 0 refills | Status: DC

## 2017-06-02 NOTE — Patient Instructions (Addendum)
Thank you for coming in today. Take the prednisone daily for 5 days.  Take the omnicef 2x daily for 1 week If not getting better next step is ENT.   Recheck as needed.   For neck pain, heating pad and TENS unit.    TENS UNIT: This is helpful for muscle pain and spasm.   Search and Purchase a TENS 7000 2nd edition at  www.tenspros.com or www.[employer].com It should be less than $30.     TENS unit instructions: Do not shower or bathe with the unit on Turn the unit off before removing electrodes or batteries If the electrodes lose stickiness add a drop of water to the electrodes after they are disconnected from the unit and place on plastic sheet. If you continued to have difficulty, call the TENS unit company to purchase more electrodes. Do not apply lotion on the skin area prior to use. Make sure the skin is clean and dry as this will help prolong the life of the electrodes. After use, always check skin for unusual red areas, rash or other skin difficulties. If there are any skin problems, does not apply electrodes to the same area. Never remove the electrodes from the unit by pulling the wires. Do not use the TENS unit or electrodes other than as directed. Do not change electrode placement without consultating your therapist or physician. Keep 2 fingers with between each electrode. Wear time ratio is 2:1, on to off times.    For example on for 30 minutes off for 15 minutes and then on for 30 minutes off for 15 minutes

## 2017-06-02 NOTE — Progress Notes (Signed)
Jean Kemp is a 47 y.o. female who presents to [facility]: [place] today for headaches.  Headaches:   Jean Kemp has a long history of sinus problems and has been followed and treated surgically by ENT. She presents today with headache, sinus pressure, and clear sinus drainage. She states this pressure is identical to prior sinus infections that had been treated with oral steroids and surgical ballooning by [facility]NT. She has been taking mucinexD, afferen, and irrigating her nose regularly with only mild relief. The headache has been persistent with little relief from tylenol. Jean Kemp cannot take other NSAIDs due to asthma like reaction.    Past Medical History:  Diagnosis Date  . Asthma   . GERD (gastroesophageal reflux disease)   . Heart murmur    mvp-never had echo  . Hypertension   . Mixed hyperlipidemia 02/18/2017  . Multiple allergies   . Vitamin D deficiency 02/18/2017   Past Surgical History:  Procedure Laterality Date  . ABDOMINAL HYSTERECTOMY    . COLONOSCOPY    . HIATAL HERNIA REPAIR    . NISSEN FUNDOPLICATION    . SEPTOPLASTY N/A 05/16/2014   Procedure: SEPTOPLASTY;  Surgeon: Leta Baptist, MD;  Location: [facility];  Service: ENT;  Laterality: N/A;  . TONSILLECTOMY    . TURBINATE REDUCTION Bilateral 05/16/2014   Procedure: BILATERAL TURBINATE REDUCTION;  Surgeon: Leta Baptist, MD;  Location: [facility];  Service: ENT;  Laterality: Bilateral;  . UPPER GI ENDOSCOPY     Social History   Tobacco Use  . Smoking status: Never Smoker  . Smokeless tobacco: Never Used  Substance Use Topics  . Alcohol use: Yes    Comment: occ   family history is not on file.  ROS as above:  Medications: Current Outpatient Medications  Medication Sig Dispense Refill  . Eflornithine HCl 13.9 % cream Apply 1 application topically 2 (two) times  daily with a meal. 45 g 2  . fluconazole (DIFLUCAN) 100 MG tablet Take 1 tablet (100 mg total) by mouth daily. 7 tablet 0  . levocetirizine (XYZAL) 5 MG tablet Take 5 mg by mouth every evening.    . montelukast (SINGULAIR) 10 MG tablet Take 10 mg by mouth at bedtime.    Marland Kitchen spironolactone (ALDACTONE) 100 MG tablet Take 1 tablet (100 mg total) by mouth daily. 90 tablet 3  . tretinoin (RETIN-A) 0.1 % cream Apply topically 2 (two) times daily. 45 g 12  . cefdinir (OMNICEF) 300 MG capsule Take 1 capsule (300 mg total) by mouth 2 (two) times daily. 14 capsule 0  . predniSONE (DELTASONE) 10 MG tablet Take 3 tablets (30 mg total) by mouth daily with breakfast. 15 tablet 0   No current facility-administered medications for this visit.    Allergies  Allergen Reactions  . Aspirin Shortness Of Breath and Other (See Comments)    wheeze wheeze Overall sinus related problems Jean Kemp reported  . Ibuprofen Shortness Of Breath    Wheezing  Wheezing  . Sulfa Antibiotics Hives  . Sulfasalazine Hives    Health Maintenance Health Maintenance  Topic Date Due  . HIV Screening  04/01/1983  . INFLUENZA VACCINE  10/09/2017 (Originally 08/27/2017)  . MAMMOGRAM  01/15/2019  . TETANUS/TDAP  06/18/2025     Exam:  BP 135/89 (BP Location: Left Arm, Jean Kemp Position: Sitting, Cuff Size: Normal)   Pulse 83   Temp 98.1 F (36.7 C) (Oral)   Wt  134 lb 11.2 oz (61.1 kg)   BMI 23.12 kg/m  Gen: Well NAD HEENT: EOMI,  MMM. Clear nasal discharge in nose and back of throat.  Lungs: Normal work of breathing. CTABL Heart: RRR no MRG Abd: NABS, Soft. Nondistended, Nontender Exts: Brisk capillary refill, warm and well perfused.    No results found for this or any previous visit (from the past 72 hour(s)). No results found.    Assessment and Plan: 47 y.o. female with sinus headache.  Sinus headache: Because of her history of sinus issues, treating this Jean Kemp's sinus problems aggressively is appropriate.  Jean Kemp already regularly uses all OTC sinus products that would normally be recommended. Prior episodes have responded best to oral steroids and antibiotics (Omnicef). We will try this now. If the Jean Kemp is not improving, we will have her consult with ENT. She previously had a ballooning procedure by ENT, but hopefully the aggressive therapy we are trying first will avoid further surgical procedures.    No orders of the defined types were placed in this encounter.  Meds ordered this encounter  Medications  . cefdinir (OMNICEF) 300 MG capsule    Sig: Take 1 capsule (300 mg total) by mouth 2 (two) times daily.    Dispense:  14 capsule    Refill:  0  . predniSONE (DELTASONE) 10 MG tablet    Sig: Take 3 tablets (30 mg total) by mouth daily with breakfast.    Dispense:  15 tablet    Refill:  0     Discussed warning signs or symptoms. Please see discharge instructions. Jean Kemp expresses understanding.

## 2017-06-29 ENCOUNTER — Ambulatory Visit (INDEPENDENT_AMBULATORY_CARE_PROVIDER_SITE_OTHER): Payer: 59 | Admitting: Family Medicine

## 2017-06-29 ENCOUNTER — Encounter: Payer: Self-pay | Admitting: Family Medicine

## 2017-06-29 VITALS — BP 122/77 | HR 92 | Ht 64.0 in | Wt 134.0 lb

## 2017-06-29 DIAGNOSIS — Z114 Encounter for screening for human immunodeficiency virus [HIV]: Secondary | ICD-10-CM | POA: Diagnosis not present

## 2017-06-29 DIAGNOSIS — R1013 Epigastric pain: Secondary | ICD-10-CM | POA: Diagnosis not present

## 2017-06-29 DIAGNOSIS — Z8719 Personal history of other diseases of the digestive system: Secondary | ICD-10-CM | POA: Insufficient documentation

## 2017-06-29 HISTORY — DX: Personal history of other diseases of the digestive system: Z87.19

## 2017-06-29 MED ORDER — SUCRALFATE 1 G PO TABS: 1.0000 g | ORAL_TABLET | Freq: Four times a day (QID) | ORAL | 0 refills | Status: DC

## 2017-06-29 MED ORDER — OMEPRAZOLE 40 MG PO CPDR: 40.0000 mg | DELAYED_RELEASE_CAPSULE | Freq: Two times a day (BID) | ORAL | 3 refills | Status: DC

## 2017-06-29 NOTE — Progress Notes (Signed)
Jean Kemp is a 47 y.o. female who presents to [facility]: [place] today for abdominal pain.   Jean Kemp notes a 2-week history of intermittent epigastric abdominal pain.  She notes the pain does not typically occur with food.  She denies any vomiting or diarrhea or constipation.  She is tried Tums and Pepcid which have not helped much.  She has a history of hiatal hernia and has a history of a Nissen fundoplication.  She thinks she had gastritis around the same time but is not sure.  She notes her current pain feels similar to the previous mild hernia pain.   ROS as above:  Exam:  BP 122/77   Pulse 92   Ht 5\' 4"  (1.626 m)   Wt 134 lb (60.8 kg)   BMI 23.00 kg/m  Gen: Well NAD HEENT: EOMI,  MMM Lungs: Normal work of breathing. CTABL Heart: RRR no MRG Abd: NABS, Soft. Nondistended, Nontender Exts: Brisk capillary refill, warm and well perfused.   Lab and Radiology Results No results found for this or any previous visit (from the past 72 hour(s)). No results found.   Assessment and Plan: 47 y.o. female with  Epigastric abdominal pain with a history of gastritis and hiatal hernia status post Nissen fundoplication.  Concerning today for gastritis.  Differential includes pancreatitis and transaminitis or gastric or duodenal ulcer. Plan for limited work-up below with CBC metabolic panel lipase and H. pylori breath test.  Empiric treatment with Carafate and twice daily omeprazole.  Recheck in the near future if not improving.  Next step may be CT scan abdomen pelvis with contrast.  Additionally will proceed with HIV screening as part of routine health maintenance.   Orders Placed This Encounter  Procedures  . CBC with Differential/Platelet  . COMPLETE METABOLIC PANEL WITH GFR  . Lipase  . HIV antibody  . H. pylori breath test   Meds ordered this encounter    Medications  . omeprazole (PRILOSEC) 40 MG capsule    Sig: Take 1 capsule (40 mg total) by mouth 2 (two) times daily.    Dispense:  60 capsule    Refill:  3  . sucralfate (CARAFATE) 1 g tablet    Sig: Take 1 tablet (1 g total) by mouth 4 (four) times daily.    Dispense:  120 tablet    Refill:  0     Historical information moved to improve visibility of documentation.  Past Medical History:  Diagnosis Date  . Asthma   . GERD (gastroesophageal reflux disease)   . Heart murmur    mvp-never had echo  . Hx of hiatal hernia 06/29/2017  . Hypertension   . Mixed hyperlipidemia 02/18/2017  . Multiple allergies   . Vitamin D deficiency 02/18/2017   Past Surgical History:  Procedure Laterality Date  . ABDOMINAL HYSTERECTOMY    . COLONOSCOPY    . HIATAL HERNIA REPAIR    . NISSEN FUNDOPLICATION    . SEPTOPLASTY N/A 05/16/2014   Procedure: SEPTOPLASTY;  Surgeon: Leta Baptist, MD;  Location: [facility];  Service: ENT;  Laterality: N/A;  . TONSILLECTOMY    . TURBINATE REDUCTION Bilateral 05/16/2014   Procedure: BILATERAL TURBINATE REDUCTION;  Surgeon: Leta Baptist, MD;  Location: [facility];  Service: ENT;  Laterality: Bilateral;  . UPPER GI ENDOSCOPY     Social History   Tobacco Use  . Smoking status: Never Smoker  . Smokeless tobacco:  Never Used  Substance Use Topics  . Alcohol use: Yes    Comment: occ   family history is not on file.  Medications: Current Outpatient Medications  Medication Sig Dispense Refill  . Eflornithine HCl 13.9 % cream Apply 1 application topically 2 (two) times daily with a meal. 45 g 2  . levocetirizine (XYZAL) 5 MG tablet Take 5 mg by mouth every evening.    . montelukast (SINGULAIR) 10 MG tablet Take 10 mg by mouth at bedtime.    Marland Kitchen spironolactone (ALDACTONE) 100 MG tablet Take 1 tablet (100 mg total) by mouth daily. 90 tablet 3  . tretinoin (RETIN-A) 0.1 % cream Apply topically 2 (two) times daily. 45 g 12  . omeprazole (PRILOSEC)  40 MG capsule Take 1 capsule (40 mg total) by mouth 2 (two) times daily. 60 capsule 3  . sucralfate (CARAFATE) 1 g tablet Take 1 tablet (1 g total) by mouth 4 (four) times daily. 120 tablet 0   No current facility-administered medications for this visit.    Allergies  Allergen Reactions  . Aspirin Shortness Of Breath and Other (See Comments)    wheeze wheeze Overall sinus related problems patient reported  . Ibuprofen Shortness Of Breath    Wheezing  Wheezing  . Sulfa Antibiotics Hives  . Sulfasalazine Hives    Health Maintenance Health Maintenance  Topic Date Due  . HIV Screening  04/01/1983  . INFLUENZA VACCINE  10/09/2017 (Originally 08/27/2017)  . MAMMOGRAM  01/15/2019  . TETANUS/TDAP  06/18/2025    Discussed warning signs or symptoms. Please see discharge instructions. Patient expresses understanding.

## 2017-06-29 NOTE — Patient Instructions (Signed)
Thank you for coming in today. Get labs now.  Start omeprazole twice daily.  Start carafate 4x dily for 1 week.  Let me know how you are doing in 1 week or so.  If not better or worsening we will likely get CT scan or Ultrasound based on lab results.    Gastritis, Adult Gastritis is inflammation of the stomach. There are two kinds of gastritis:  Acute gastritis. This kind develops suddenly.  Chronic gastritis. This kind lasts for a long time.  Gastritis happens when the lining of the stomach becomes weak or gets damaged. Without treatment, gastritis can lead to stomach bleeding and ulcers. What are the causes? This condition may be caused by:  An infection.  Drinking too much alcohol.  Certain medicines.  Having too much acid in the stomach.  A disease of the intestines or stomach.  Stress.  What are the signs or symptoms? Symptoms of this condition include:  Pain or a burning in the upper abdomen.  Nausea.  Vomiting.  An uncomfortable feeling of fullness after eating.  In some cases, there are no symptoms. How is this diagnosed? This condition may be diagnosed with:  A description of your symptoms.  A physical exam.  Tests. These can include: ? Blood tests. ? Stool tests. ? A test in which a thin, flexible instrument with a light and camera on the end is passed down the esophagus and into the stomach (upper endoscopy). ? A test in which a sample of tissue is taken for testing (biopsy).  How is this treated? This condition may be treated with medicines. If the condition is caused by a bacterial infection, you may be given antibiotic medicines. If it is caused by too much acid in the stomach, you may get medicines called H2 blockers, proton pump inhibitors, or antacids. Treatment may also involve stopping the use of certain medicines, such as aspirin, ibuprofen, or other nonsteroidal anti-inflammatory drugs (NSAIDs). Follow these instructions at home:  Take  over-the-counter and prescription medicines only as told by your health care provider.  If you were prescribed an antibiotic, take it as told by your health care provider. Do not stop taking the antibiotic even if you start to feel better.  Drink enough fluid to keep your urine clear or pale yellow.  Eat small, frequent meals instead of large meals. Contact a health care provider if:  Your symptoms get worse.  Your symptoms return after treatment. Get help right away if:  You vomit blood or material that looks like coffee grounds.  You have black or dark red stools.  You are unable to keep fluids down.  Your abdominal pain gets worse.  You have a fever.  You do not feel better after 1 week. This information is not intended to replace advice given to you by your health care provider. Make sure you discuss any questions you have with your health care provider. Document Released: 01/07/2001 Document Revised: 09/12/2015 Document Reviewed: 10/07/2014 Elsevier Interactive Patient Education  [employer].

## 2017-06-30 LAB — COMPLETE METABOLIC PANEL WITH GFR
AG RATIO: 1.9 (calc) (ref 1.0–2.5)
ALBUMIN MSPROF: 4.1 g/dL (ref 3.6–5.1)
ALKALINE PHOSPHATASE (APISO): 66 U/L (ref 33–115)
ALT: 11 U/L (ref 6–29)
AST: 14 U/L (ref 10–35)
BUN: 14 mg/dL (ref 7–25)
CALCIUM: 9.2 mg/dL (ref 8.6–10.2)
CO2: 27 mmol/L (ref 20–32)
CREATININE: 0.96 mg/dL (ref 0.50–1.10)
Chloride: 104 mmol/L (ref 98–110)
GFR, EST NON AFRICAN AMERICAN: 69 mL/min/{1.73_m2} (ref >=60)
GFR, Est African American: 80 mL/min/{1.73_m2} (ref >=60)
Globulin: 2.2 g/dL (calc) (ref 1.9–3.7)
Glucose, Bld: 95 mg/dL (ref 65–99)
Potassium: 3.8 mmol/L (ref 3.5–5.3)
SODIUM: 138 mmol/L (ref 135–146)
Total Bilirubin: 0.3 mg/dL (ref 0.2–1.2)
Total Protein: 6.3 g/dL (ref 6.1–8.1)

## 2017-06-30 LAB — CBC WITH DIFFERENTIAL/PLATELET
BASOS ABS: 20 {cells}/uL (ref 0–200)
Basophils Relative: 0.7 %
EOS ABS: 109 {cells}/uL (ref 15–500)
EOS PCT: 3.9 %
HCT: 38.3 % (ref 35.0–45.0)
HEMOGLOBIN: 13.4 g/dL (ref 11.7–15.5)
LYMPHS ABS: 778 {cells}/uL — AB (ref 850–3900)
MCH: 32.7 pg (ref 27.0–33.0)
MCHC: 35 g/dL (ref 32.0–36.0)
MCV: 93.4 fL (ref 80.0–100.0)
MPV: 9.8 fL (ref 7.5–12.5)
Monocytes Relative: 14.2 %
NEUTROS ABS: 1495 {cells}/uL — AB (ref 1500–7800)
Neutrophils Relative %: 53.4 %
Platelets: 280 10*3/uL (ref 140–400)
RBC: 4.1 10*6/uL (ref 3.80–5.10)
RDW: 12.3 % (ref 11.0–15.0)
Total Lymphocyte: 27.8 %
WBC: 2.8 10*3/uL — ABNORMAL LOW (ref 3.8–10.8)
WBCMIX: 398 {cells}/uL (ref 200–950)

## 2017-06-30 LAB — H. PYLORI BREATH TEST: H. PYLORI BREATH TEST: NOT DETECTED

## 2017-06-30 LAB — LIPASE: Lipase: 20 U/L (ref 7–60)

## 2017-07-01 LAB — HIV ANTIBODY (ROUTINE TESTING W REFLEX): HIV 1&2 Ab, 4th Generation: NONREACTIVE

## 2017-07-09 ENCOUNTER — Encounter: Payer: Self-pay | Admitting: Family Medicine

## 2017-07-26 ENCOUNTER — Other Ambulatory Visit: Payer: Self-pay | Admitting: Family Medicine

## 2017-11-18 DIAGNOSIS — Z131 Encounter for screening for diabetes mellitus: Secondary | ICD-10-CM | POA: Diagnosis not present

## 2017-11-18 DIAGNOSIS — G4700 Insomnia, unspecified: Secondary | ICD-10-CM | POA: Diagnosis not present

## 2017-11-18 DIAGNOSIS — I10 Essential (primary) hypertension: Secondary | ICD-10-CM | POA: Diagnosis not present

## 2017-11-18 DIAGNOSIS — D485 Neoplasm of uncertain behavior of skin: Secondary | ICD-10-CM | POA: Diagnosis not present

## 2017-11-18 DIAGNOSIS — Z124 Encounter for screening for malignant neoplasm of cervix: Secondary | ICD-10-CM | POA: Diagnosis not present

## 2017-11-18 DIAGNOSIS — Z13220 Encounter for screening for lipoid disorders: Secondary | ICD-10-CM | POA: Diagnosis not present

## 2018-03-09 ENCOUNTER — Encounter: Payer: Self-pay | Admitting: Family Medicine

## 2018-03-09 ENCOUNTER — Ambulatory Visit (INDEPENDENT_AMBULATORY_CARE_PROVIDER_SITE_OTHER): Payer: 59 | Admitting: Family Medicine

## 2018-03-09 ENCOUNTER — Ambulatory Visit (INDEPENDENT_AMBULATORY_CARE_PROVIDER_SITE_OTHER): Payer: 59

## 2018-03-09 VITALS — BP 134/79 | HR 88 | Ht 64.5 in | Wt 139.0 lb

## 2018-03-09 DIAGNOSIS — Z8719 Personal history of other diseases of the digestive system: Secondary | ICD-10-CM

## 2018-03-09 DIAGNOSIS — R1013 Epigastric pain: Secondary | ICD-10-CM

## 2018-03-09 MED ORDER — OMEPRAZOLE 40 MG PO CPDR: 40.0000 mg | DELAYED_RELEASE_CAPSULE | Freq: Two times a day (BID) | ORAL | 3 refills | Status: DC

## 2018-03-09 MED ORDER — SUCRALFATE 1 G PO TABS: 1.0000 g | ORAL_TABLET | Freq: Four times a day (QID) | ORAL | 0 refills | Status: AC

## 2018-03-09 NOTE — Progress Notes (Signed)
Jean Kemp is a 48 y.o. female who presents to [facility]: [place] today for epigastric abdominal pain.  [name] notes a several day history of epigastric abdominal pain radiating to the upper left quadrant.  This occurred a few days ago and is improving.  She is had similar pain in the past.  She restarted her omeprazole which has not been helpful.  Additionally she has been taking Rolaids.  She denies any vomiting or diarrhea.  She denies any blood in the stool.  Pain is somewhat consistent with previous episodes of pain.  She had stopped her omeprazole since her last flareup in June.  She does have a pertinent history for Nissen fundoplication in [place] years ago.   ROS as above:  Exam:  BP 134/79   Pulse 88   Ht 5' 4.5" (1.638 m)   Wt 139 lb (63 kg)   BMI 23.49 kg/m  Wt Readings from Last 5 Encounters:  03/09/18 139 lb (63 kg)  06/29/17 134 lb (60.8 kg)  06/02/17 134 lb 11.2 oz (61.1 kg)  02/06/17 134 lb (60.8 kg)  04/21/16 133 lb 6.4 oz (60.5 kg)    Gen: Well NAD nontoxic appearing HEENT: EOMI,  MMM Lungs: Normal work of breathing. CTABL Heart: RRR no MRG Abd: NABS, Soft. Nondistended, Nontender no masses palpated Exts: Brisk capillary refill, warm and well perfused.   Lab and Radiology Results Two-view abdomen with chest x-ray images personally independently reviewed No acute infiltrates in chest.  Gas present in the colon.  Gas bubble present in the stomach.  Stool present. Await formal radiology review   Assessment and Plan: 48 y.o. female with  Left upper quadrant abdominal pain.  Likely gastritis based on history symptoms.  Previous work-up has been unremarkable.  Given the recurrent nature of her symptoms I think it is reasonable to refer to gastroenterology likely for upper endoscopy.  Plan for basic lab work-up below as well.  Start  empiric treatment with omeprazole and Carafate.  PDMP not reviewed this encounter. Orders Placed This Encounter  Procedures  . DG Abd Acute W/Chest    Standing Status:   Future    Number of Occurrences:   1    Standing Expiration Date:   05/08/2019    Order Specific Question:   Reason for Exam (SYMPTOM  OR DIAGNOSIS REQUIRED)    Answer:   eval epigastric abd pain to chest    Order Specific Question:   Is patient pregnant?    Answer:   No    Order Specific Question:   Preferred imaging location?    Answer:   [facility]    Order Specific Question:   Radiology Contrast Protocol - do NOT remove file path    Answer:   [email or web address]  . CBC  . COMPLETE METABOLIC PANEL WITH GFR  . Lipase  . Ambulatory referral to Gastroenterology    Referral Priority:   Routine    Referral Type:   Consultation    Referral Reason:   Specialty Services Required    Referred to Provider:   Lavena Bullion, DO    Number of Visits Requested:   1   Meds ordered this encounter  Medications  . omeprazole (PRILOSEC) 40 MG capsule    Sig: Take 1 capsule (40 mg total) by mouth 2 (two) times daily.    Dispense:  60 capsule    Refill:  3  . sucralfate (CARAFATE)  1 g tablet    Sig: Take 1 tablet (1 g total) by mouth 4 (four) times daily.    Dispense:  120 tablet    Refill:  0     Historical information moved to improve visibility of documentation.  Past Medical History:  Diagnosis Date  . Asthma   . GERD (gastroesophageal reflux disease)   . Heart murmur    mvp-never had echo  . Hx of hiatal hernia 06/29/2017  . Hypertension   . Mixed hyperlipidemia 02/18/2017  . Multiple allergies   . Vitamin D deficiency 02/18/2017   Past Surgical History:  Procedure Laterality Date  . ABDOMINAL HYSTERECTOMY    . COLONOSCOPY    . HIATAL HERNIA REPAIR    . NISSEN FUNDOPLICATION    . SEPTOPLASTY N/A 05/16/2014   Procedure: SEPTOPLASTY;  Surgeon: Leta Baptist, MD;   Location: [facility];  Service: ENT;  Laterality: N/A;  . TONSILLECTOMY    . TURBINATE REDUCTION Bilateral 05/16/2014   Procedure: BILATERAL TURBINATE REDUCTION;  Surgeon: Leta Baptist, MD;  Location: [facility];  Service: ENT;  Laterality: Bilateral;  . UPPER GI ENDOSCOPY     Social History   Tobacco Use  . Smoking status: Never Smoker  . Smokeless tobacco: Never Used  Substance Use Topics  . Alcohol use: Yes    Comment: occ   family history is not on file.  Medications: Current Outpatient Medications  Medication Sig Dispense Refill  . Eflornithine HCl 13.9 % cream Apply 1 application topically 2 (two) times daily with a meal. 45 g 2  . levocetirizine (XYZAL) 5 MG tablet Take 5 mg by mouth every evening.    . montelukast (SINGULAIR) 10 MG tablet Take 10 mg by mouth at bedtime.    Marland Kitchen omeprazole (PRILOSEC) 40 MG capsule Take 1 capsule (40 mg total) by mouth 2 (two) times daily. 60 capsule 3  . spironolactone (ALDACTONE) 100 MG tablet Take 1 tablet (100 mg total) by mouth daily. 90 tablet 3  . sucralfate (CARAFATE) 1 g tablet Take 1 tablet (1 g total) by mouth 4 (four) times daily. 120 tablet 0  . tretinoin (RETIN-A) 0.1 % cream Apply topically 2 (two) times daily. 45 g 12   No current facility-administered medications for this visit.    Allergies  Allergen Reactions  . Aspirin Shortness Of Breath and Other (See Comments)    wheeze wheeze Overall sinus related problems patient reported  . Ibuprofen Shortness Of Breath    Wheezing  Wheezing  . Sulfa Antibiotics Hives  . Sulfasalazine Hives     Discussed warning signs or symptoms. Please see discharge instructions. Patient expresses understanding.

## 2018-03-09 NOTE — Patient Instructions (Addendum)
Thank you for coming in today. Restart omeprazole twice daily for 2-4 weeks.  Then transition to 1x daily.   Take carafate 4x daily for 1 week.   Get xray abd today and labs in the near future.    Gastritis, Adult Gastritis is inflammation of the stomach. There are two kinds of gastritis:  Acute gastritis. This kind develops suddenly.  Chronic gastritis. This kind is much more common and lasts for a long time. Gastritis happens when the lining of the stomach becomes weak or gets damaged. Without treatment, gastritis can lead to stomach bleeding and ulcers. What are the causes? This condition may be caused by:  An infection.  Drinking too much alcohol.  Certain medicines. These include steroids, antibiotics, and some over-the-counter medicines, such as aspirin or ibuprofen.  Having too much acid in the stomach.  A disease of the intestines or stomach.  Stress.  An allergic reaction.  Crohn's disease.  Some cancer treatments (radiation). Sometimes the cause of this condition is not known. What are the signs or symptoms? Symptoms of this condition include:  Pain or a burning sensation in the upper abdomen.  Nausea.  Vomiting.  An uncomfortable feeling of fullness after eating.  Weight loss.  Bad breath.  Blood in your vomit or stools. In some cases, there are no symptoms. How is this diagnosed? This condition may be diagnosed with:  Your medical history and a description of your symptoms.  A physical exam.  Tests. These can include: ? Blood tests. ? Stool tests. ? A test in which a thin, flexible instrument with a light and a camera is passed down the esophagus and into the stomach (upper endoscopy). ? A test in which a sample of tissue is taken for testing (biopsy). How is this treated? This condition may be treated with medicines. The medicines that are used vary depending on the cause of the gastritis:  If the condition is caused by a bacterial  infection, you may be given antibiotic medicines.  If the condition is caused by too much acid in the stomach, you may be given medicines called H2 blockers, proton pump inhibitors, or antacids. Treatment may also involve stopping the use of certain medicines, such as aspirin, ibuprofen, or other NSAIDs. Follow these instructions at home: Medicines  Take over-the-counter and prescription medicines only as told by your health care provider.  If you were prescribed an antibiotic medicine, take it as told by your health care provider. Do not stop taking the antibiotic even if you start to feel better. Eating and drinking   Eat small, frequent meals instead of large meals.  Avoid foods and drinks that make your symptoms worse.  Drink enough fluid to keep your urine pale yellow. Alcohol use  Do not drink alcohol if: ? Your health care provider tells you not to drink. ? You are pregnant, may be pregnant, or are planning to become pregnant.  If you drink alcohol: ? Limit your use to:  0-1 drink a day for women.  0-2 drinks a day for men. ? Be aware of how much alcohol is in your drink. In the [place] one drink equals one 12 oz bottle of beer (355 mL), one 5 oz glass of wine (148 mL), or one 1 oz glass of hard liquor (44 mL). General instructions  Talk with your health care provider about ways to manage stress, such as getting regular exercise or practicing deep breathing, meditation, or yoga.  Do not use any products  that contain nicotine or tobacco, such as cigarettes and e-cigarettes. If you need help quitting, ask your health care provider.  Keep all follow-up visits as told by your health care provider. This is important. Contact a health care provider if:  Your symptoms get worse.  Your symptoms return after treatment. Get help right away if:  You vomit blood or material that looks like coffee grounds.  You have black or dark red stools.  You are unable to keep fluids  down.  Your abdominal pain gets worse.  You have a fever.  You do not feel better after one week. Summary  Gastritis is inflammation of the lining of the stomach that can occur suddenly (acute) or develop slowly over time (chronic).  This condition is diagnosed with a medical history, a physical exam, or tests.  This condition may be treated with medicines to treat infection or medicines to reduce the amount of acid in your stomach.  Follow your health care provider's instructions about taking medicines, making changes to your diet, and knowing when to call for help. This information is not intended to replace advice given to you by your health care provider. Make sure you discuss any questions you have with your health care provider. Document Released: 01/07/2001 Document Revised: 06/02/2017 Document Reviewed: 06/02/2017 Elsevier Interactive Patient Education  2019 [employer].

## 2018-03-11 LAB — COMPLETE METABOLIC PANEL WITH GFR
AG Ratio: 1.7 (calc) (ref 1.0–2.5)
ALT: 12 U/L (ref 6–29)
AST: 15 U/L (ref 10–35)
Albumin: 4.5 g/dL (ref 3.6–5.1)
Alkaline phosphatase (APISO): 88 U/L (ref 31–125)
BUN/Creatinine Ratio: 13 (calc) (ref 6–22)
BUN: 15 mg/dL (ref 7–25)
CO2: 27 mmol/L (ref 20–32)
Calcium: 10 mg/dL (ref 8.6–10.2)
Chloride: 100 mmol/L (ref 98–110)
Creat: 1.13 mg/dL — ABNORMAL HIGH (ref 0.50–1.10)
GFR, Est African American: 66 mL/min/{1.73_m2} (ref >=60)
GFR, Est Non African American: 57 mL/min/{1.73_m2} — ABNORMAL LOW (ref >=60)
Globulin: 2.6 g/dL (calc) (ref 1.9–3.7)
Glucose, Bld: 90 mg/dL (ref 65–99)
Potassium: 3.8 mmol/L (ref 3.5–5.3)
Sodium: 137 mmol/L (ref 135–146)
Total Bilirubin: 0.5 mg/dL (ref 0.2–1.2)
Total Protein: 7.1 g/dL (ref 6.1–8.1)

## 2018-03-11 LAB — CBC
HCT: 42.4 % (ref 35.0–45.0)
Hemoglobin: 14.4 g/dL (ref 11.7–15.5)
MCH: 32.3 pg (ref 27.0–33.0)
MCHC: 34 g/dL (ref 32.0–36.0)
MCV: 95.1 fL (ref 80.0–100.0)
MPV: 10 fL (ref 7.5–12.5)
Platelets: 325 10*3/uL (ref 140–400)
RBC: 4.46 10*6/uL (ref 3.80–5.10)
RDW: 12.6 % (ref 11.0–15.0)
WBC: 4.4 10*3/uL (ref 3.8–10.8)

## 2018-03-11 LAB — LIPASE: Lipase: 15 U/L (ref 7–60)

## 2018-03-12 ENCOUNTER — Encounter: Payer: Self-pay | Admitting: Gastroenterology

## 2018-03-12 ENCOUNTER — Ambulatory Visit (INDEPENDENT_AMBULATORY_CARE_PROVIDER_SITE_OTHER): Payer: 59 | Admitting: Gastroenterology

## 2018-03-12 VITALS — BP 128/80 | HR 93 | Ht 64.5 in | Wt 140.1 lb

## 2018-03-12 DIAGNOSIS — R1013 Epigastric pain: Secondary | ICD-10-CM | POA: Diagnosis not present

## 2018-03-12 DIAGNOSIS — Z1211 Encounter for screening for malignant neoplasm of colon: Secondary | ICD-10-CM

## 2018-03-12 DIAGNOSIS — R140 Abdominal distension (gaseous): Secondary | ICD-10-CM | POA: Diagnosis not present

## 2018-03-12 DIAGNOSIS — Z1212 Encounter for screening for malignant neoplasm of rectum: Secondary | ICD-10-CM | POA: Diagnosis not present

## 2018-03-12 MED ORDER — SOD PICOSULFATE-MAG OX-CIT ACD 10-3.5-12 MG-GM -GM/160ML PO SOLN: 1.0000 | ORAL | 0 refills | Status: DC

## 2018-03-12 NOTE — Progress Notes (Signed)
Chief Complaint: Epigastric pain, abdominal bloating  Referring Provider:     Zella Ball, MD   HPI:    Jean Kemp is a 48 y.o. female referred to the [facility]ology [facility] for evaluation of epigastric pain.  Reports MEG pain radiating to the LUQ and left shoulder blade.  She is experience similar symptoms in the past.  Symptoms tend to last for a few days, but this episode was longer than previous episodes. She restarted omeprazole and Rolaids for the symptoms without appreciable improvement. No associated nausea, vomiting, diarrhea, constipation, fever, chills.  No hematochezia or melena.   She was seen by her PCM on 03/09/2018 for this issue. Was prescribed omeprazole BID for diagnostic/therapeutic intent and Carafate and referred to GI for further evaluation.  She reports her symptoms have improved a bit with the high dose PPI.  She reports sxs can worsen with certain foods and stress as well.   Does not take any NSAIDs.   History of Nissen fundoplication in [place] approx 2013.  This was done for both correction of hiatal hernia and treatment of dyspepsia (less bothersome was HB/regurgitation) with goal of stopping PPI therapy. Resolution of dyspepsia with the Nissen but does report bloating and gas since surgery.  Toaday's sxs seem the same as her index sxs prior to the Nissen.   No known family history of CRC, GI malignancy, liver disease, pancreatic disease, or IBD.   Endoscopic Hx: - EGD (unsure when; [place]): Hiatal hernia, gastritis per patient - No previous colonoscopy   Past Medical History:  Diagnosis Date  . Asthma   . GERD (gastroesophageal reflux disease)   . Heart murmur    mvp-never had echo  . Hx of hiatal hernia 06/29/2017  . Hypertension   . Mixed hyperlipidemia 02/18/2017  . Multiple allergies   . Vitamin D deficiency 02/18/2017     Past Surgical History:  Procedure Laterality Date  . ABDOMINAL HYSTERECTOMY    . COLONOSCOPY      . HIATAL HERNIA REPAIR    . NISSEN FUNDOPLICATION    . SEPTOPLASTY N/A 05/16/2014   Procedure: SEPTOPLASTY;  Surgeon: Leta Baptist, MD;  Location: [facility];  Service: ENT;  Laterality: N/A;  . TONSILLECTOMY    . TURBINATE REDUCTION Bilateral 05/16/2014   Procedure: BILATERAL TURBINATE REDUCTION;  Surgeon: Leta Baptist, MD;  Location: [facility];  Service: ENT;  Laterality: Bilateral;  . UPPER GI ENDOSCOPY     In [place] in 2012-2013   Family History  Problem Relation Age of Onset  . Colon cancer Neg Hx   . Esophageal cancer Neg Hx   . Breast cancer Neg Hx   . Stomach cancer Neg Hx    Social History   Tobacco Use  . Smoking status: Never Smoker  . Smokeless tobacco: Never Used  Substance Use Topics  . Alcohol use: Yes    Comment: occ  . Drug use: No   Current Outpatient Medications  Medication Sig Dispense Refill  . levocetirizine (XYZAL) 5 MG tablet Take 5 mg by mouth as needed.     . montelukast (SINGULAIR) 10 MG tablet Take 10 mg by mouth as needed.     Marland Kitchen omeprazole (PRILOSEC) 40 MG capsule Take 1 capsule (40 mg total) by mouth 2 (two) times daily. 60 capsule 3  . spironolactone (ALDACTONE) 100 MG tablet Take 1 tablet (100 mg total) by mouth daily. [place]  tablet 3  . sucralfate (CARAFATE) 1 g tablet Take 1 tablet (1 g total) by mouth 4 (four) times daily. 120 tablet 0  . traZODone (DESYREL) 50 MG tablet Take 50 mg by mouth at bedtime.    . tretinoin (RETIN-A) 0.1 % cream Apply topically 2 (two) times daily. 45 g 12  . Eflornithine HCl 13.9 % cream Apply 1 application topically 2 (two) times daily with a meal. (Patient not taking: Reported on 03/12/2018) 45 g 2   No current facility-administered medications for this visit.    Allergies  Allergen Reactions  . Aspirin Shortness Of Breath and Other (See Comments)    wheeze wheeze Overall sinus related problems patient reported  . Ibuprofen Shortness Of Breath    Wheezing  Wheezing  . Sulfa Antibiotics  Hives  . Sulfasalazine Hives     Review of Systems: All systems reviewed and negative except where noted in HPI.     Physical Exam:    Wt Readings from Last 3 Encounters:  03/12/18 140 lb 2 oz (63.6 kg)  03/09/18 139 lb (63 kg)  06/29/17 134 lb (60.8 kg)    Ht 5' 4.5" (1.638 m)   Wt 140 lb 2 oz (63.6 kg)   BMI 23.68 kg/m  Constitutional:  Pleasant, in no acute distress. Psychiatric: Normal mood and affect. Behavior is normal. EENT: Pupils normal.  Conjunctivae are normal. No scleral icterus. Neck supple. No cervical LAD. Cardiovascular: Normal rate, regular rhythm. No edema Pulmonary/chest: Effort normal and breath sounds normal. No wheezing, rales or rhonchi. Abdominal: Soft, nondistended, nontender. Bowel sounds active throughout. There are no masses palpable. No hepatomegaly. Neurological: Alert and oriented to person place and time. Skin: Skin is warm and dry. No rashes noted.   ASSESSMENT AND PLAN;   Jean Kemp is a 48 y.o. female presenting with:  1) MEG pain: Episodic MEG pain.  Per her history, history of gastritis.  Additionally, prior Nissen fundoplication for dyspepsia and hiatal hernia repair.  Discussed the broad DDX for her presenting symptoms, to include PUD, gastritis, unwrapped Nissen, nonulcer dyspepsia, atypical reflux, etc. and will proceed as below:  - EGD with random and directed biopsies -Resume high-dose PPI for now given overall therapeutic response  2) History of Nissen fundoplication: Reports a history of hiatal hernia and nonulcer dyspepsia, corrected with Nissen fundoplication.  Now with return of index symptoms which could be consistent with loosening of her previous wrap.  Will evaluate wrap integrity at time of EGD as above  3) CRC screening: No previous colonoscopy.  Otherwise due for age-appropriate, average risk screening.  No family history of colon cancer and no current LGI symptoms.  -Colonoscopy  4)  Bloating: Intermittent abdominal bloating likely secondary to previous Nissen is a well described surgical side effect.  Evaluate wrap integrity at time of EGD as above.  If wrap intact, can consider PRN treatment for bloating with simethicone OTC.  The indications, risks, and benefits of EGD and colonoscopy were explained to the patient in detail. Risks include but are not limited to bleeding, perforation, adverse reaction to medications, and cardiopulmonary compromise. Sequelae include but are not limited to the possibility of surgery, hositalization, and mortality. The patient verbalized understanding and wished to proceed. All questions answered, referred to scheduler and bowel prep ordered. Further recommendations pending results of the exam.     Lavena Bullion, DO, FACG  03/12/2018, 9:43 AM   Gregor Hams, MD

## 2018-03-12 NOTE — Patient Instructions (Signed)
If you are age 50 or older, your body mass index should be between 23-30. Your Body mass index is 23.68 kg/m. If this is out of the aforementioned range listed, please consider follow up with your Primary Care Provider.  If you are age 16 or younger, your body mass index should be between 19-25. Your Body mass index is 23.68 kg/m. If this is out of the aformentioned range listed, please consider follow up with your Primary Care Provider.   You have been scheduled for an endoscopy and colonoscopy. Please follow the written instructions given to you at your visit today. Please pick up your prep supplies at the pharmacy within the next 1-3 days. If you use inhalers (even only as needed), please bring them with you on the day of your procedure. Your physician has requested that you go to www.startemmi.com and enter the access code given to you at your visit today. This web site gives a general overview about your procedure. However, you should still follow specific instructions given to you by our office regarding your preparation for the procedure.  We have sent the following medications to your pharmacy for you to pick up at your convenience: Clenpiq  It was a pleasure to see you today!  Vito Cirigliano, D.O.

## 2018-03-24 ENCOUNTER — Encounter: Payer: Self-pay | Admitting: Gastroenterology

## 2018-03-24 ENCOUNTER — Ambulatory Visit (AMBULATORY_SURGERY_CENTER): Payer: 59 | Admitting: Gastroenterology

## 2018-03-24 VITALS — BP 122/80 | HR 78 | Temp 98.6°F | Resp 16 | Ht 64.5 in | Wt 140.0 lb

## 2018-03-24 DIAGNOSIS — Z1211 Encounter for screening for malignant neoplasm of colon: Secondary | ICD-10-CM | POA: Diagnosis not present

## 2018-03-24 DIAGNOSIS — R1013 Epigastric pain: Secondary | ICD-10-CM

## 2018-03-24 DIAGNOSIS — K317 Polyp of stomach and duodenum: Secondary | ICD-10-CM | POA: Diagnosis not present

## 2018-03-24 DIAGNOSIS — K219 Gastro-esophageal reflux disease without esophagitis: Secondary | ICD-10-CM

## 2018-03-24 DIAGNOSIS — Z9889 Other specified postprocedural states: Secondary | ICD-10-CM

## 2018-03-24 DIAGNOSIS — D125 Benign neoplasm of sigmoid colon: Secondary | ICD-10-CM

## 2018-03-24 DIAGNOSIS — K640 First degree hemorrhoids: Secondary | ICD-10-CM

## 2018-03-24 MED ORDER — SODIUM CHLORIDE 0.9 % IV SOLN: 500.0000 mL | Freq: Once | INTRAVENOUS | Status: DC

## 2018-03-24 NOTE — Op Note (Signed)
[facility] Patient Name: Kooper Chriswell Procedure Date: 03/24/2018 1:52 PM MRN: [id number] Endoscopist: Gerrit Heck , MD Age: 48 Referring MD:  Date of Birth: 09-19-1970 Gender: Female Account #: [id number] Procedure:                Upper GI endoscopy Indications:              Epigastric abdominal pain, Esophageal reflux,                            Assessment following Nissen fundoplication,                            Abdominal bloating                           History of reflux and dyspepsia, relieved with                            Nissen Fundoplication in [address] in [place], now with                            return of index symptoms. Additionally, she                            endorses MEG pain and abdominal bloating. Medicines:                Monitored Anesthesia Care Procedure:                Pre-Anesthesia Assessment:                           - Prior to the procedure, a History and Physical                            was performed, and patient medications and                            allergies were reviewed. The patient's tolerance of                            previous anesthesia was also reviewed. The risks                            and benefits of the procedure and the sedation                            options and risks were discussed with the patient.                            All questions were answered, and informed consent                            was obtained. Prior Anticoagulants: The patient has  taken no previous anticoagulant or antiplatelet                            agents. ASA Grade Assessment: II - A patient with                            mild systemic disease. After reviewing the risks                            and benefits, the patient was deemed in                            satisfactory condition to undergo the procedure.                           After obtaining informed consent, the endoscope  was                            passed under direct vision. Throughout the                            procedure, the patient's blood pressure, pulse, and                            oxygen saturations were monitored continuously. The                            Endoscope was introduced through the mouth, and                            advanced to the second part of duodenum. The upper                            GI endoscopy was accomplished without difficulty.                            The patient tolerated the procedure well. Scope In: Scope Out: Findings:                 The examined esophagus was normal.                           The Z-line was regular and was found 37 cm from the                            incisors.                           Evidence of a Nissen fundoplication was found in                            the cardia. This was traversed. On retroflexed                            views,  the wrap appeared loose, particularly on the                            anterior side.                           The gastric fundus, gastric body, incisura, gastric                            antrum and pylorus were normal. Biopsies were taken                            with a cold forceps for Helicobacter pylori                            testing. Estimated blood loss was minimal.                           The duodenal bulb and second portion of the                            duodenum were normal. Biopsies for histology were                            taken with a cold forceps for evaluation of celiac                            disease. Estimated blood loss was minimal.                           A single 5 mm submucosal nodule was found in the                            first portion of the duodenum. The overlying mucosa                            was otherwise normal appearing. The lesion was                            mobile using a closed forceps, and therfore not                             biopsied in favor of further evaluation with                            Endoscopic Ultrasound (EUS) with possible resection. Complications:            No immediate complications. Estimated Blood Loss:     Estimated blood loss was minimal. Impression:               - Normal esophagus.                           - Z-line regular, 37 cm from the incisors.                           -  A Nissen fundoplication was found. The wrap                            appears loose.                           - Normal gastric fundus, gastric body, incisura,                            antrum and pylorus. Biopsied.                           - Normal duodenal bulb and second portion of the                            duodenum. Biopsied.                           - Submucosal nodule found in the duodenum. The                            lesion was mobile using a closed forceps, and                            therfore not biopsied in favor of further                            evaluation with Endoscopic Ultrasound (EUS) with                            possible resection. Recommendation:           - Patient has a contact number available for                            emergencies. The signs and symptoms of potential                            delayed complications were discussed with the                            patient. Return to normal activities tomorrow.                            Written discharge instructions were provided to the                            patient.                           - Resume previous diet today.                           - Continue present medications.                           - Await pathology  results.                           - Perform an upper endoscopic ultrasound (UEUS) at                            appointment to be scheduled for evaluation of the                            duodenal nodule.                           - Return to GI clinic at appointment to be                             scheduled.                           - If you would like to discuss endoscopic                            correction of the Nissen fundoplication with                            Transoral Incisionless Fundoplication (TIF), please                            schedule a follow-up appointment with Dr.                            Bryan Lemma. Will plan on further evaluation of wrap                            integrity with barium esophagram with discussion of                            appropriate surgical intervention options. Gerrit Heck, MD 03/24/2018 3:00:03 PM

## 2018-03-24 NOTE — Patient Instructions (Signed)
Continue present medications. Await pathology results. Please read handouts provided. Return to GI clinic as needed.     YOU HAD AN ENDOSCOPIC PROCEDURE TODAY AT [facility]:   Refer to the procedure report that was given to you for any specific questions about what was found during the examination.  If the procedure report does not answer your questions, please call your gastroenterologist to clarify.  If you requested that your care partner not be given the details of your procedure findings, then the procedure report has been included in a sealed envelope for you to review at your convenience later.  YOU SHOULD EXPECT: Some feelings of bloating in the abdomen. Passage of more gas than usual.  Walking can help get rid of the air that was put into your GI tract during the procedure and reduce the bloating. If you had a lower endoscopy (such as a colonoscopy or flexible sigmoidoscopy) you may notice spotting of blood in your stool or on the toilet paper. If you underwent a bowel prep for your procedure, you may not have a normal bowel movement for a few days.  Please Note:  You might notice some irritation and congestion in your nose or some drainage.  This is from the oxygen used during your procedure.  There is no need for concern and it should clear up in a day or so.  SYMPTOMS TO REPORT IMMEDIATELY:   Following lower endoscopy (colonoscopy or flexible sigmoidoscopy):  Excessive amounts of blood in the stool  Significant tenderness or worsening of abdominal pains  Swelling of the abdomen that is new, acute  Fever of 100F or higher   Following upper endoscopy (EGD)  Vomiting of blood or coffee ground material  New chest pain or pain under the shoulder blades  Painful or persistently difficult swallowing  New shortness of breath  Fever of 100F or higher  Black, tarry-looking stools  For urgent or emergent issues, a gastroenterologist can be reached at any hour by  calling [phone].   DIET:  We do recommend a small meal at first, but then you may proceed to your regular diet.  Drink plenty of fluids but you should avoid alcoholic beverages for 24 hours.  ACTIVITY:  You should plan to take it easy for the rest of today and you should NOT DRIVE or use heavy machinery until tomorrow (because of the sedation medicines used during the test).    FOLLOW UP: Our staff will call the number listed on your records the next business day following your procedure to check on you and address any questions or concerns that you may have regarding the information given to you following your procedure. If we do not reach you, we will leave a message.  However, if you are feeling well and you are not experiencing any problems, there is no need to return our call.  We will assume that you have returned to your regular daily activities without incident.  If any biopsies were taken you will be contacted by phone or by letter within the next 1-3 weeks.  Please call us at [phone] if you have not heard about the biopsies in 3 weeks.    SIGNATURES/CONFIDENTIALITY: You and/or your care partner have signed paperwork which will be entered into your electronic medical record.  These signatures attest to the fact that that the information above on your After Visit Summary has been reviewed and is understood.  Full responsibility of the confidentiality of this  discharge information lies with you and/or your care-partner. 

## 2018-03-24 NOTE — Op Note (Signed)
[facility] Patient Name: Jean Kemp Procedure Date: 03/24/2018 1:52 PM MRN: [id number] Endoscopist: Gerrit Heck , MD Age: 48 Referring MD:  Date of Birth: 03-22-1970 Gender: Female Account #: [id number] Procedure:                Colonoscopy Indications:              Screening for colorectal malignant neoplasm, This                            is the patient's first colonoscopy Medicines:                Monitored Anesthesia Care Procedure:                Pre-Anesthesia Assessment:                           - Prior to the procedure, a History and Physical                            was performed, and patient medications and                            allergies were reviewed. The patient's tolerance of                            previous anesthesia was also reviewed. The risks                            and benefits of the procedure and the sedation                            options and risks were discussed with the patient.                            All questions were answered, and informed consent                            was obtained. Prior Anticoagulants: The patient has                            taken no previous anticoagulant or antiplatelet                            agents. ASA Grade Assessment: II - A patient with                            mild systemic disease. After reviewing the risks                            and benefits, the patient was deemed in                            satisfactory condition to undergo the procedure.  After obtaining informed consent, the colonoscope                            was passed under direct vision. Throughout the                            procedure, the patient's blood pressure, pulse, and                            oxygen saturations were monitored continuously. The                            Colonoscope was introduced through the anus and                            advanced to  the the cecum, identified by                            appendiceal orifice and ileocecal valve. The                            colonoscopy was performed without difficulty. The                            patient tolerated the procedure well. The quality                            of the bowel preparation was adequate. The                            ileocecal valve, appendiceal orifice, and rectum                            were photographed. Scope In: 2:25:31 PM Scope Out: 2:45:49 PM Scope Withdrawal Time: 0 hours 12 minutes 21 seconds  Total Procedure Duration: 0 hours 20 minutes 18 seconds  Findings:                 The perianal and digital rectal examinations were                            normal.                           A 5 mm polyp was found in the sigmoid colon. The                            polyp was sessile. The polyp was removed with a                            cold snare. Resection and retrieval were complete.                            Estimated blood loss was minimal.  Non-bleeding internal hemorrhoids were found during                            retroflexion. The hemorrhoids were small.                           The exam was otherwise normal throughout the                            examined colon. Complications:            No immediate complications. Estimated Blood Loss:     Estimated blood loss was minimal. Impression:               - One 5 mm polyp in the sigmoid colon, removed with                            a cold snare. Resected and retrieved.                           - Non-bleeding internal hemorrhoids. Recommendation:           - Patient has a contact number available for                            emergencies. The signs and symptoms of potential                            delayed complications were discussed with the                            patient. Return to normal activities tomorrow.                            Written  discharge instructions were provided to the                            patient.                           - Resume previous diet today.                           - Continue present medications.                           - Await pathology results.                           - Repeat colonoscopy in 7-10 years for surveillance                            based on pathology results.                           - Return to GI office PRN. Gerrit Heck, MD 03/24/2018 3:03:07 PM

## 2018-03-24 NOTE — Progress Notes (Signed)
A/ox3, pleased with MAC, report to RN 

## 2018-03-24 NOTE — Progress Notes (Signed)
Called to room to assist during endoscopic procedure.  Patient ID and intended procedure confirmed with present staff. Received instructions for my participation in the procedure from the performing physician.  

## 2018-03-25 ENCOUNTER — Telehealth: Payer: Self-pay

## 2018-03-25 ENCOUNTER — Telehealth: Payer: Self-pay | Admitting: *Deleted

## 2018-03-25 NOTE — Telephone Encounter (Signed)
No answer for post procedure call back. Left message for patient to call back and will attempt to call back later this afternoon. SM

## 2018-03-25 NOTE — Telephone Encounter (Signed)
  Follow up Call-  Call back number 03/24/2018  Post procedure Call Back phone  # [phone]  Permission to leave phone message Yes  Some recent data might be hidden     Patient questions:  Do you have a fever, pain , or abdominal swelling? No. Pain Score  0 *  Have you tolerated food without any problems? Yes.    Have you been able to return to your normal activities? Yes.    Do you have any questions about your discharge instructions: Diet   No. Medications  No. Follow up visit  No.  Do you have questions or concerns about your Care? No.  Actions: * If pain score is 4 or above: No action needed, pain <4.

## 2018-03-25 NOTE — Telephone Encounter (Signed)
Per the upper gi endo/colonoscopy procedure report from 03/24/2018 -Would you like to have the upper endoscopic ultrasound scheduled with Dr. Ardis Hughs or Dr. Rush Landmark or is this something that needs to be scheduled with you? Please advise

## 2018-03-26 ENCOUNTER — Encounter: Payer: 59 | Admitting: Gastroenterology

## 2018-03-26 NOTE — Telephone Encounter (Signed)
Thanks.  I forwarded a message to Drs. Ardis Hughs and Mansouraty to review endoscopy report and consider EUS.  Nothing further that you need to take care of at this time.  Thank you.

## 2018-03-29 ENCOUNTER — Telehealth: Payer: Self-pay

## 2018-03-29 ENCOUNTER — Other Ambulatory Visit: Payer: Self-pay

## 2018-03-29 DIAGNOSIS — K3189 Other diseases of stomach and duodenum: Secondary | ICD-10-CM

## 2018-03-29 NOTE — Telephone Encounter (Signed)
Left message on machine to call back  NEED WRITTEN INSTRUCTIONS

## 2018-03-29 NOTE — Telephone Encounter (Signed)
-----   Message from Milus Banister, MD sent at 03/29/2018  5:50 AM EST ----- Regarding: RE: Duodenal subepithelial nodule Rondell Reams;  I have not been doing EMR of duodenum and so I'll direct Ovida Delagarza that this should be with Gabe only.  Thanks  Zlaty Alexa, See above.  Thanks  ----- Message ----- From: Irving Copas., MD Sent: 03/28/2018  10:48 PM EST To: Milus Banister, MD, [place], DO Subject: RE: Duodenal subepithelial nodule              Vito, I think and EUS first is reasonable. If it looks to be a Layer 1/2 lesion we can consider attempt at EMR. If deeper then will need to surveillance or could consider FUll thickness resection when it becomes FDA approved. If it looks deeper then we ahould attempt tunneled biopsies at that time of not FNA-able. Ivery Nanney please schedule EUS with Linna Hoff or myself in the coming weeks. Thanks. ----- Message ----- From: Lavena Bullion, DO Sent: 03/26/2018   7:54 AM EST To: Milus Banister, MD, # Subject: Duodenal subepithelial nodule                  Good morning,  I did an EGD on this patient earlier this week (for GERD after prior Nissen, abdominal pain) and incidentally noted a 5 mm subepithelial nodule in D1.  Normal overlying mucosa.  Was firm and mobile with closed forceps.  I did not biopsy the overlying normal-appearing mucosa in favor of further evaluation with EUS then resection as appropriate, pending your advice.  Thanks![employer]

## 2018-03-29 NOTE — Telephone Encounter (Signed)
appt made for 04/12/18 at 145 pm [facility] with Dr Rush Landmark

## 2018-03-30 NOTE — Telephone Encounter (Signed)
EUS scheduled, pt instructed and medications reviewed.  Patient instructions mailed to home.  Patient to call with any questions or concerns.  

## 2018-04-02 ENCOUNTER — Encounter: Payer: Self-pay | Admitting: Gastroenterology

## 2018-04-07 ENCOUNTER — Other Ambulatory Visit: Payer: Self-pay | Admitting: Family Medicine

## 2018-04-09 ENCOUNTER — Encounter (HOSPITAL_COMMUNITY): Payer: Self-pay | Admitting: *Deleted

## 2018-04-09 ENCOUNTER — Other Ambulatory Visit: Payer: Self-pay

## 2018-04-09 NOTE — Progress Notes (Signed)
Spoke with pt for pre-op call. Pt states she was told she has MVP "years ago", never had any problems per pt. Denies any other cardiac problems.  EKG -05/11/14

## 2018-04-12 ENCOUNTER — Ambulatory Visit (HOSPITAL_COMMUNITY): Payer: 59 | Admitting: Anesthesiology

## 2018-04-12 ENCOUNTER — Encounter (HOSPITAL_COMMUNITY)
Admission: RE | Disposition: A | Discharge status: Home / Self Care | Payer: Self-pay | Source: Home / Self Care | Attending: Gastroenterology

## 2018-04-12 ENCOUNTER — Other Ambulatory Visit: Payer: Self-pay

## 2018-04-12 ENCOUNTER — Ambulatory Visit (HOSPITAL_COMMUNITY)
Admission: RE | Admit: 2018-04-12 | Discharge: 2018-04-12 | Disposition: A | Discharge status: Home / Self Care | Payer: 59 | Attending: Gastroenterology | Admitting: Gastroenterology

## 2018-04-12 ENCOUNTER — Encounter (HOSPITAL_COMMUNITY): Payer: Self-pay | Admitting: Gastroenterology

## 2018-04-12 DIAGNOSIS — Z882 Allergy status to sulfonamides status: Secondary | ICD-10-CM | POA: Insufficient documentation

## 2018-04-12 DIAGNOSIS — J45909 Unspecified asthma, uncomplicated: Secondary | ICD-10-CM | POA: Diagnosis not present

## 2018-04-12 DIAGNOSIS — I10 Essential (primary) hypertension: Secondary | ICD-10-CM | POA: Insufficient documentation

## 2018-04-12 DIAGNOSIS — E559 Vitamin D deficiency, unspecified: Secondary | ICD-10-CM | POA: Insufficient documentation

## 2018-04-12 DIAGNOSIS — K219 Gastro-esophageal reflux disease without esophagitis: Secondary | ICD-10-CM | POA: Diagnosis not present

## 2018-04-12 DIAGNOSIS — K2980 Duodenitis without bleeding: Secondary | ICD-10-CM | POA: Insufficient documentation

## 2018-04-12 DIAGNOSIS — Z9889 Other specified postprocedural states: Secondary | ICD-10-CM | POA: Diagnosis not present

## 2018-04-12 DIAGNOSIS — E782 Mixed hyperlipidemia: Secondary | ICD-10-CM | POA: Diagnosis not present

## 2018-04-12 DIAGNOSIS — K3189 Other diseases of stomach and duodenum: Secondary | ICD-10-CM | POA: Diagnosis not present

## 2018-04-12 DIAGNOSIS — Z886 Allergy status to analgesic agent status: Secondary | ICD-10-CM | POA: Diagnosis not present

## 2018-04-12 DIAGNOSIS — Q438 Other specified congenital malformations of intestine: Secondary | ICD-10-CM | POA: Diagnosis present

## 2018-04-12 HISTORY — PX: BIOPSY: SHX5522

## 2018-04-12 HISTORY — DX: Headache: R51

## 2018-04-12 HISTORY — PX: EUS: SHX5427

## 2018-04-12 HISTORY — PX: SUBMUCOSAL TATTOO INJECTION: SHX6856

## 2018-04-12 HISTORY — DX: Headache, unspecified: R51.9

## 2018-04-12 HISTORY — DX: Personal history of other diseases of the digestive system: Z87.19

## 2018-04-12 HISTORY — PX: SUBMUCOSAL LIFTING INJECTION: SHX6855

## 2018-04-12 HISTORY — PX: ESOPHAGOGASTRODUODENOSCOPY (EGD) WITH PROPOFOL: SHX5813

## 2018-04-12 SURGERY — UPPER ENDOSCOPIC ULTRASOUND (EUS) RADIAL
Anesthesia: Monitor Anesthesia Care

## 2018-04-12 MED ORDER — SODIUM CHLORIDE 0.9 % IV SOLN: INTRAVENOUS | Status: DC

## 2018-04-12 MED ORDER — PROPOFOL 500 MG/50ML IV EMUL
INTRAVENOUS | Status: DC | PRN
  Administered 2018-04-12: 75 ug/kg/min via INTRAVENOUS
  Administered 2018-04-12: 100 ug/kg/min via INTRAVENOUS

## 2018-04-12 MED ORDER — LIDOCAINE HCL (CARDIAC) PF 100 MG/5ML IV SOSY
PREFILLED_SYRINGE | INTRAVENOUS | Status: DC | PRN
  Administered 2018-04-12: 60 mg via INTRAVENOUS

## 2018-04-12 MED ORDER — SPOT INK MARKER SYRINGE KIT
PACK | SUBMUCOSAL | Status: DC | PRN
  Administered 2018-04-12: 1 mL via SUBMUCOSAL

## 2018-04-12 MED ORDER — METOCLOPRAMIDE HCL 5 MG/ML IJ SOLN: 10.0000 mg | Freq: Once | INTRAMUSCULAR | Status: DC | PRN

## 2018-04-12 MED ORDER — PROPOFOL 10 MG/ML IV BOLUS
INTRAVENOUS | Status: DC | PRN
  Administered 2018-04-12: 20 mg via INTRAVENOUS
  Administered 2018-04-12: 30 mg via INTRAVENOUS
  Administered 2018-04-12: 20 mg via INTRAVENOUS
  Administered 2018-04-12 (×2): 40 mg via INTRAVENOUS
  Administered 2018-04-12 (×3): 20 mg via INTRAVENOUS
  Administered 2018-04-12: 30 mg via INTRAVENOUS
  Administered 2018-04-12: 40 mg via INTRAVENOUS
  Administered 2018-04-12 (×2): 30 mg via INTRAVENOUS
  Administered 2018-04-12 (×4): 20 mg via INTRAVENOUS

## 2018-04-12 MED ORDER — LACTATED RINGERS IV SOLN
INTRAVENOUS | Status: DC
  Administered 2018-04-12 (×2): via INTRAVENOUS

## 2018-04-12 NOTE — H&P (Signed)
GASTROENTEROLOGY OUTPATIENT PROCEDURE H&P NOTE   Primary Care Physician: Gregor Hams, MD  HPI: Jean Kemp is a 48 y.o. female who presents for EUS with possible EMR.  Past Medical History:  Diagnosis Date  . Asthma   . GERD (gastroesophageal reflux disease)   . Headache    sinus  . Heart murmur    mvp-never had echo  . History of hiatal hernia    had Nissen Fundoplication  . Hx of hiatal hernia 06/29/2017  . Hypertension   . Mixed hyperlipidemia 02/18/2017  . Multiple allergies   . Vitamin D deficiency 02/18/2017   Past Surgical History:  Procedure Laterality Date  . ABDOMINAL HYSTERECTOMY    . COLONOSCOPY    . HERNIA REPAIR     abdominal hernia   . HIATAL HERNIA REPAIR    . NISSEN FUNDOPLICATION    . SEPTOPLASTY N/A 05/16/2014   Procedure: SEPTOPLASTY;  Surgeon: Leta Baptist, MD;  Location: [facility];  Service: ENT;  Laterality: N/A;  . TONSILLECTOMY    . TURBINATE REDUCTION Bilateral 05/16/2014   Procedure: BILATERAL TURBINATE REDUCTION;  Surgeon: Leta Baptist, MD;  Location: [facility];  Service: ENT;  Laterality: Bilateral;  . UPPER GI ENDOSCOPY     In [place] in 2012-2013   Current Facility-Administered Medications  Medication Dose Route Frequency Provider Last Rate Last Dose  . 0.9 %  sodium chloride infusion   Intravenous Continuous Mansouraty, Telford Nab., MD       Allergies  Allergen Reactions  . Aspirin Shortness Of Breath and Other (See Comments)    wheeze wheeze Overall sinus related problems patient reported  . Ibuprofen Shortness Of Breath    Wheezing  Wheezing  . Sulfa Antibiotics Hives  . Sulfasalazine Hives   Family History  Problem Relation Age of Onset  . Colon cancer Neg Hx   . Esophageal cancer Neg Hx   . Breast cancer Neg Hx   . Stomach cancer Neg Hx    Social History   Socioeconomic History  . Marital status: Married    Spouse name: Not on file  . Number of children: 2  . Years of  education: Not on file  . Highest education level: Not on file  Occupational History  . Occupation: attorney  Social Needs  . Financial resource strain: Not on file  . Food insecurity:    Worry: Not on file    Inability: Not on file  . Transportation needs:    Medical: Not on file    Non-medical: Not on file  Tobacco Use  . Smoking status: Never Smoker  . Smokeless tobacco: Never Used  Substance and Sexual Activity  . Alcohol use: Yes    Comment: occ  . Drug use: No  . Sexual activity: Not on file  Lifestyle  . Physical activity:    Days per week: Not on file    Minutes per session: Not on file  . Stress: Not on file  Relationships  . Social connections:    Talks on phone: Not on file    Gets together: Not on file    Attends religious service: Not on file    Active member of club or organization: Not on file    Attends meetings of clubs or organizations: Not on file    Relationship status: Not on file  . Intimate partner violence:    Fear of current or ex partner: Not on file    Emotionally abused:  Not on file    Physically abused: Not on file    Forced sexual activity: Not on file  Other Topics Concern  . Not on file  Social History Narrative  . Not on file    Physical Exam: Vital signs in last 24 hours:     GEN: NAD EYE: Sclerae anicteric ENT: MMM CV: RR without R/Gs  RESP: CTAB posteriorly GI: Soft, NT/ND NEURO:  Alert & Oriented x 3  Lab Results: No results for input(s): WBC, HGB, HCT, PLT in the last 72 hours. BMET No results for input(s): NA, K, CL, CO2, GLUCOSE, BUN, CREATININE, CALCIUM in the last 72 hours. LFT No results for input(s): PROT, ALBUMIN, AST, ALT, ALKPHOS, BILITOT, BILIDIR, IBILI in the last 72 hours. PT/INR No results for input(s): LABPROT, INR in the last 72 hours.   Impression / Plan: This is a 48 y.o.femalewho presents for EUS with possible EMR  The risks and benefits of endoscopic evaluation were discussed with the  patient; these include but are not limited to the risk of perforation, infection, bleeding, missed lesions, lack of diagnosis, severe illness requiring hospitalization, as well as anesthesia and sedation related illnesses.  The patient is agreeable to proceed.   The risks of EUS including bleeding, infection, aspiration pneumonia and intestinal perforation were discussed as was the possibility it may not give a definitive diagnosis.  If a biopsy of the pancreas is done as part of the EUS, there is an additional risk of pancreatitis at the rate of about 1%.  It was explained that procedure related pancreatitis is typically mild, although can be severe and even life threatening, which is why we do not perform random pancreatic biopsies and only biopsy a lesion we feel is concerning enough to warrant the risk.  We do not anticipate a pancreatic lesion.   Justice Britain, MD [facility] Advanced Endoscopy Office # [phone]

## 2018-04-12 NOTE — Op Note (Signed)
[facility] Patient Name: Jean Kemp Procedure Date : 04/12/2018 MRN: [id number] Attending MD: Justice Britain , MD Date of Birth: 09/27/1970 CSN: [id number] Age: 48 Admit Type: Outpatient Procedure:                Upper EUS Indications:              Duodenal deformity on endoscopy/Subepithelial tumor                            vs. extrinsic compression Providers:                Justice Britain, MD, Burtis Junes, RN, Elspeth Cho Tech., Technician, Raphael Gibney, CRNA Referring MD:             Gerrit Heck, MD Medicines:                Monitored Anesthesia Care Complications:            No immediate complications. Estimated Blood Loss:     Estimated blood loss was minimal. Procedure:                Pre-Anesthesia Assessment:                           - Prior to the procedure, a History and Physical                            was performed, and patient medications and                            allergies were reviewed. The patient's tolerance of                            previous anesthesia was also reviewed. The risks                            and benefits of the procedure and the sedation                            options and risks were discussed with the patient.                            All questions were answered, and informed consent                            was obtained. Prior Anticoagulants: The patient has                            taken no previous anticoagulant or antiplatelet                            agents. ASA Grade Assessment: II - A patient with  mild systemic disease. After reviewing the risks                            and benefits, the patient was deemed in                            satisfactory condition to undergo the procedure.                           After obtaining informed consent, the endoscope was                            passed under direct vision.  Throughout the                            procedure, the patient's blood pressure, pulse, and                            oxygen saturations were monitored continuously. The                            [id number] ([id number]) Olympus therapeutic                            gastroscope was introduced through the mouth, and                            advanced to the second part of duodenum. The                            [id number] ([id number]) Olympus Radial EUS scope was                            introduced through the mouth, and advanced to the                            duodenum for ultrasound examination from the                            stomach and duodenum. The upper EUS was somewhat                            difficult due to the patient's poor cooperation.                            The patient tolerated the procedure. Scope In: Scope Out: Findings:      ENDOSCOPIC FINDING: :      No gross lesions were noted in the entire esophagus.      Evidence of a fundoplication was found at the gastroesophageal junction       and in the cardia. The wrap appeared loose. This was traversed.      No gross lesions were noted in the entire examined stomach.      The duodenal bulb was normal.  A single 4-5 mm submucosal nodule was found in the D1/D2 sweep of the       duodenum. Biopsies were taken with a cold forceps for histology.      No gross lesions were noted in the second portion of the duodenum.      ENDOSONOGRAPHIC FINDING: :      The visualization within the duodenum was very poor due to the patient's       coughing during the procedure even under maximum sedation. An oval       intramural (subepithelial) lesion was found in the apex of the duodenal       bulb. The lesion was hyperechoic. Endosonographically, the lesion       appeared to originate from within the deep mucosa (Layer 2) and       submucosa (Layer 3). The lesion also appeared to involve the following       wall layer(s):  intramural wall, but the wall layer could not be       determined. The lesion measured 4.2 mm (in maximum thickness) by 3.9 mm       in diameter. The outer margins were defined.      Endosonographic imaging in the cardia of the stomach, in the body of the       stomach and in the antrum of the stomach showed no intramural       (subepithelial) lesion.      No malignant-appearing lymph nodes were visualized in the gastrohepatic       ligament (level 18), celiac region (level 20), perigastric region and       peripancreatic region.      The celiac region was visualized. Impression:               EGD Impression:                           - No gross lesions in esophagus.                           - A fundoplication was found. The wrap appears                            loose.                           - No gross lesions in the stomach.                           - Normal duodenal bulb.                           - Submucosal nodule found in the duodenum. Biopsied.                           - No gross lesions in the second portion of the                            duodenum.                           EUS Impression:                           -  An intramural (subepithelial) lesion was found in                            the apex of the duodenal bulb. The lesion appeared                            to originate from within the deep mucosa (Layer 2)                            and submucosa (Layer 3). Tissue was obtained from                            this exam, and results are pending. However, the                            endosonographic appearance is suggestive of a                            lipoma.                           - No malignant-appearing lymph nodes were                            visualized in the gastrohepatic ligament (level                            18), celiac region (level 20), perigastric region                            and peripancreatic region. Recommendation:            - The patient will be observed post-procedure,                            until all discharge criteria are met.                           - Discharge patient to home.                           - Observe patient's clinical course.                           - Await pathology results.                           - If evidence of a Lipoma then no further EUS                            surveillance/follow up will be required. Otherwise,                            will consider EUS in 1-year for surveillance of the  nodule based on the pathology and then consider the                            role of FTRD which will be FDA approved at that                            time.                           - Continue current medications.                           - The findings and recommendations were discussed                            with the patient.                           - The findings and recommendations were discussed                            with the patient's family. Procedure Code(s):        --- Professional ---                           [phone], Esophagogastroduodenoscopy, flexible,                            transoral; with endoscopic ultrasound examination                            limited to the esophagus, stomach or duodenum, and                            adjacent structures Diagnosis Code(s):        --- Professional ---                           U04.540, Other specified postprocedural states                           K31.89, Other diseases of stomach and duodenum                           I89.9, Noninfective disorder of lymphatic vessels                            and lymph nodes, unspecified CPT copyright 2018 American Medical Association. All rights reserved. The codes documented in this report are preliminary and upon coder review may  be revised to meet current compliance requirements. Justice Britain, MD 04/12/2018 2:32:10 PM Number of  Addenda: 0

## 2018-04-12 NOTE — Anesthesia Preprocedure Evaluation (Signed)
Anesthesia Evaluation  Patient identified by MRN, date of birth, ID band Patient awake    Reviewed: Allergy & Precautions, NPO status , Patient's Chart, lab work & pertinent test results  Airway Mallampati: I  TM Distance: >3 FB Neck ROM: Full    Dental no notable dental hx. (+) Teeth Intact   Pulmonary asthma ,    Pulmonary exam normal breath sounds clear to auscultation       Cardiovascular hypertension, Pt. on medications Normal cardiovascular exam+ Valvular Problems/Murmurs  Rhythm:Regular Rate:Normal     Neuro/Psych  Headaches, negative psych ROS   GI/Hepatic Neg liver ROS, hiatal hernia, GERD  Medicated and Controlled,Duodenal mass   Endo/Other  negative endocrine ROS  Renal/GU negative Renal ROS  negative genitourinary   Musculoskeletal negative musculoskeletal ROS (+)   Abdominal   Peds  Hematology negative hematology ROS (+)   Anesthesia Other Findings   Reproductive/Obstetrics                             Anesthesia Physical  Anesthesia Plan  ASA: II  Anesthesia Plan: MAC   Post-op Pain Management:    Induction: Intravenous  PONV Risk Score and Plan: 2  Airway Management Planned: Natural Airway and Nasal Cannula  Additional Equipment:   Intra-op Plan:   Post-operative Plan:   Informed Consent: I have reviewed the patients History and Physical, chart, labs and discussed the procedure including the risks, benefits and alternatives for the proposed anesthesia with the patient or authorized representative who has indicated his/her understanding and acceptance.     Dental advisory given  Plan Discussed with: CRNA and Surgeon  Anesthesia Plan Comments:         Anesthesia Quick Evaluation  

## 2018-04-12 NOTE — Anesthesia Postprocedure Evaluation (Signed)
Anesthesia Post Note  Patient: Jean Kemp  Procedure(s) Performed: UPPER ENDOSCOPIC ULTRASOUND (EUS) RADIAL (N/A ) ENDOSCOPIC MUCOSAL RESECTION (N/A ) BIOPSY SCHLEROTHERAPY OF VARICES SUBMUCOSAL TATTOO INJECTION     Patient location during evaluation: PACU Anesthesia Type: MAC Level of consciousness: awake and alert Pain management: pain level controlled Vital Signs Assessment: post-procedure vital signs reviewed and stable Respiratory status: spontaneous breathing, nonlabored ventilation and respiratory function stable Cardiovascular status: stable and blood pressure returned to baseline Postop Assessment: no apparent nausea or vomiting Anesthetic complications: no    Last Vitals:  Vitals:   04/12/18 1223 04/12/18 1421  BP: 129/73 127/83  Pulse: 89 82  Resp: 12 20  Temp: 36.7 C (!) 36.3 C  SpO2: 100% 100%    Last Pain:  Vitals:   04/12/18 1421  TempSrc: Axillary  PainSc: 0-No pain                 Jeremy Ditullio A.

## 2018-04-12 NOTE — Discharge Instructions (Signed)
YOU HAD AN ENDOSCOPIC PROCEDURE TODAY: Refer to the procedure report and other information in the discharge instructions given to you for any specific questions about what was found during the examination. If this information does not answer your questions, please call [place] office at [phone] to clarify.   YOU SHOULD EXPECT: Some feelings of bloating in the abdomen. Passage of more gas than usual. Walking can help get rid of the air that was put into your GI tract during the procedure and reduce the bloating. If you had a lower endoscopy (such as a colonoscopy or flexible sigmoidoscopy) you may notice spotting of blood in your stool or on the toilet paper. Some abdominal soreness may be present for a day or two, also.  DIET: Your first meal following the procedure should be a light meal and then it is ok to progress to your normal diet. A half-sandwich or bowl of soup is an example of a good first meal. Heavy or fried foods are harder to digest and may make you feel nauseous or bloated. Drink plenty of fluids but you should avoid alcoholic beverages for 24 hours. If you had a esophageal dilation, please see attached instructions for diet.    ACTIVITY: Your care partner should take you home directly after the procedure. You should plan to take it easy, moving slowly for the rest of the day. You can resume normal activity the day after the procedure however YOU SHOULD NOT DRIVE, use power tools, machinery or perform tasks that involve climbing or major physical exertion for 24 hours (because of the sedation medicines used during the test).   SYMPTOMS TO REPORT IMMEDIATELY: A gastroenterologist can be reached at any hour. Please call [phone]  for any of the following symptoms:   Following upper endoscopy (EGD, EUS, ERCP, esophageal dilation) Vomiting of blood or coffee ground material  New, significant abdominal pain  New, significant chest pain or pain under the shoulder blades  Painful or  persistently difficult swallowing  New shortness of breath  Black, tarry-looking or red, bloody stools  FOLLOW UP:  If any biopsies were taken you will be contacted by phone or by letter within the next 1-3 weeks. Call [phone]  if you have not heard about the biopsies in 3 weeks.  Please also call with any specific questions about appointments or follow up tests.  

## 2018-04-12 NOTE — Transfer of Care (Signed)
Immediate Anesthesia Transfer of Care Note  Patient: Jean Kemp  Procedure(s) Performed: UPPER ENDOSCOPIC ULTRASOUND (EUS) RADIAL (N/A ) ENDOSCOPIC MUCOSAL RESECTION (N/A ) BIOPSY SCHLEROTHERAPY OF VARICES SUBMUCOSAL TATTOO INJECTION  Patient Location: PACU  Anesthesia Type:MAC  Level of Consciousness: drowsy  Airway & Oxygen Therapy: Patient Spontanous Breathing and Patient connected to nasal cannula oxygen  Post-op Assessment: Report given to RN, Post -op Vital signs reviewed and stable and Patient moving all extremities  Post vital signs: Reviewed and stable  Last Vitals:  Vitals Value Taken Time  BP    Temp    Pulse    Resp    SpO2      Last Pain:  Vitals:   04/12/18 1223  TempSrc: Oral  PainSc: 0-No pain         Complications: No apparent anesthesia complications

## 2018-04-17 ENCOUNTER — Encounter (HOSPITAL_COMMUNITY): Payer: Self-pay | Admitting: Gastroenterology

## 2018-04-18 ENCOUNTER — Encounter: Payer: Self-pay | Admitting: Gastroenterology

## 2018-04-19 ENCOUNTER — Telehealth: Payer: Self-pay | Admitting: Gastroenterology

## 2018-04-19 NOTE — Telephone Encounter (Signed)
Pt called in stating that she is having bloating pain in upper left  side of back,stomack discomfort and wanted to speak with the nurse for advice and possible see the doctor for a follow up

## 2018-04-19 NOTE — Telephone Encounter (Signed)
Patient reassured. Thanks me for this information. She agrees to follow the recommendations. She is interested in Dr Cirigliano's thoughts also.

## 2018-04-19 NOTE — Telephone Encounter (Signed)
Please recommend that she stay on her proton pump inhibitor once daily.  Continue taking Gas-X 3-4 times a day.  I will forward it to Dr. Bryan Lemma.  Please reassure her that it is unlikely anything serious

## 2018-04-19 NOTE — Telephone Encounter (Signed)
Doc of the Day Patient is having the sharp pain and bloating that she says she originally came to see [place] for. She had her bowel movement. She was outside washing her car and bent over. Then she had a sharp pain in her upper back. She now has bloating with the pain in her back. She is on Omeprazole. She took Carafate and has taken Gas-X. She is using a heating pad to her back. She asks if the current symptoms are due to the loose wrap and is there anything else she can do at this time. She also volunteers she understands that this may not warrant an emergency visit and she was unsure of what the planned follow up will be. I am unsure who should receive this message in Dr Vivia Ewing absence.

## 2018-04-20 NOTE — Telephone Encounter (Signed)
Message sent to Jean Kemp to help patient schedule an appointment in 6 weeks.

## 2018-04-20 NOTE — Telephone Encounter (Signed)
I spoke with the patient today. Feeling otherwise well, just exacerbation of her known regurgitation 2/2 loose wrap. Plan to f/u with me in 6 weeks and can discuss TIF further at that time. All questions answered.

## 2018-04-21 NOTE — Telephone Encounter (Signed)
Patient added to my list for F/U Office visit in 6 weeks to discuss TIF and current symptoms.

## 2018-06-16 ENCOUNTER — Other Ambulatory Visit: Payer: Self-pay

## 2018-06-16 ENCOUNTER — Encounter: Payer: Self-pay | Admitting: Gastroenterology

## 2018-06-16 ENCOUNTER — Telehealth (INDEPENDENT_AMBULATORY_CARE_PROVIDER_SITE_OTHER): Payer: 59 | Admitting: Gastroenterology

## 2018-06-16 VITALS — Ht 64.0 in | Wt 135.0 lb

## 2018-06-16 DIAGNOSIS — K3189 Other diseases of stomach and duodenum: Secondary | ICD-10-CM

## 2018-06-16 DIAGNOSIS — K219 Gastro-esophageal reflux disease without esophagitis: Secondary | ICD-10-CM | POA: Diagnosis not present

## 2018-06-16 DIAGNOSIS — R140 Abdominal distension (gaseous): Secondary | ICD-10-CM

## 2018-06-16 DIAGNOSIS — K6389 Other specified diseases of intestine: Secondary | ICD-10-CM

## 2018-06-16 DIAGNOSIS — Z8719 Personal history of other diseases of the digestive system: Secondary | ICD-10-CM

## 2018-06-16 DIAGNOSIS — R109 Unspecified abdominal pain: Secondary | ICD-10-CM

## 2018-06-16 DIAGNOSIS — Z9889 Other specified postprocedural states: Secondary | ICD-10-CM

## 2018-06-16 MED ORDER — HYOSCYAMINE SULFATE 0.125 MG SL SUBL: 0.1250 mg | SUBLINGUAL_TABLET | Freq: Four times a day (QID) | SUBLINGUAL | 2 refills | Status: DC | PRN

## 2018-06-16 MED ORDER — RIFAXIMIN 550 MG PO TABS: 550.0000 mg | ORAL_TABLET | Freq: Two times a day (BID) | ORAL | 0 refills | Status: DC

## 2018-06-16 MED ORDER — ALIGN PO CAPS: 1.0000 | ORAL_CAPSULE | Freq: Every day | ORAL | 6 refills | Status: DC

## 2018-06-16 NOTE — Patient Instructions (Addendum)
To help prevent the possible spread of infection to our patients, communities, and staff; we will be implementing the following measures:  As of now we are not allowing any visitors/family members to accompany you to any upcoming appointments with [facility]. If you have any concerns about this please contact our office to discuss prior to the appointment.   We have sent the following medications to your pharmacy for you to pick up at your convenience: Levsin Rifaximin Align   Please call us back to let us know who was your GI doctor/surgeron in [place].   Please call us back in 3 months to schedule follow up appointment at [phone].

## 2018-06-16 NOTE — Progress Notes (Signed)
Chief Complaint: GERD, abdominal bloating, duodenal nodule   Referring Provider:     Gregor Hams, MD  GI History: 48 year old female initially seen in 02/2018 for MEG pain and abdominal bloating along with longstanding history of reflux with Nissen fundoplication/lap hernia repair in [place] in approximately 2013.  Index reflux symptoms of dyspepsia, heartburn, regurgitation, with resolution with fundoplication.  Did develop bloating and gas distention since surgery.  MEG/LUQ pain reported as the same as prior to fundoplication dyspepsia symptoms, which had resolved with fundoplication, but recurred in 2020.  Repeat EGD in 03/24/2018 notable for loose wrap with recommendation for TIF. Add  Endoscopic Hx: -EUS (03/2018, Dr. Rush Landmark): 4.2 x 3.9 mm intramural lesion originating from layer 2 and later 3, benign-appearing with appearance suggestive of lipoma.  Tunneled biopsies: Peptic duodenitis.  Recommended repeat EUS in 6 months -Colonoscopy (02/2018, Dr. Bryan Lemma): 5 mm adenoma, internal hemorrhoids.  Repeat in 7 years -EGD (02/2018, Dr. Bryan Lemma): Evidence of Nissen fundoplication with loose appearing wrap, particularly on the anterior side.  Normal stomach, biopsies negative for H. pylori.  5 mm submucosal nodule in the first portion of the duodenum with normal overlying mucosa. - EGD (unsure when; [place]): Hiatal hernia, gastritis and possible erosive esophagitis per patient   HPI:    Due to current restrictions/limitations of in-office visits due to the COVID-19 pandemic, this scheduled clinical appointment was converted to a telehealth virtual consultation using D[employer].  -Time of medical discussion: 25 minutes -The patient did consent to this virtual visit and is aware of possible charges through their insurance for this visit.  -Names of all parties present: Jean Kemp (patient), Gerrit Heck, DO, [facility] (physician) -Patient location: Home  -Physician location: Office  Jean Kemp is a 48 y.o. female presenting to the Gastroenterology Clinic for routine follow-up.  Initially seen by me in 02/2018 as above.  Today, she states she is still w intermittent bloating and gas pains. Seems to be related to PO intake, but has not pinpointed certain foods. Not much improvement w/ Gas-X (improves bloating but not pain). The Prilosec o/w will help with her MEG pain/dyspepsia, but she is reluctant take this daily due to fear of medication ADR profile.  Reflux symptoms (heartburn, regurgitation) relatively well controlled, and resolve when taking PPI.  Main complaint is of abdominal bloating, visible distention.  Past medical history, past surgical history, social history, family history, medications, and allergies reviewed in the chart and with patient.    Past Medical History:  Diagnosis Date  . Asthma   . GERD (gastroesophageal reflux disease)   . Headache    sinus  . Heart murmur    mvp-never had echo  . History of hiatal hernia    had Nissen Fundoplication  . Hx of hiatal hernia 06/29/2017  . Hypertension   . Mixed hyperlipidemia 02/18/2017  . Multiple allergies   . Vitamin D deficiency 02/18/2017     Past Surgical History:  Procedure Laterality Date  . ABDOMINAL HYSTERECTOMY    . BIOPSY  04/12/2018   Procedure: BIOPSY;  Surgeon: Rush Landmark Telford Nab., MD;  Location: [place];  Service: Gastroenterology;;  . COLONOSCOPY    . ESOPHAGOGASTRODUODENOSCOPY (EGD) WITH PROPOFOL N/A 04/12/2018   Procedure: ESOPHAGOGASTRODUODENOSCOPY (EGD) WITH PROPOFOL;  Surgeon: Rush Landmark Telford Nab., MD;  Location: [place];  Service: Gastroenterology;  Laterality: N/A;  . EUS N/A 04/12/2018   Procedure: UPPER ENDOSCOPIC ULTRASOUND (EUS) RADIAL;  Surgeon:  Mansouraty, Telford Nab., MD;  Location: [place];  Service: Gastroenterology;  Laterality: N/A;  . HERNIA REPAIR     abdominal hernia   . HIATAL HERNIA REPAIR    .  NISSEN FUNDOPLICATION    . SEPTOPLASTY N/A 05/16/2014   Procedure: SEPTOPLASTY;  Surgeon: Leta Baptist, MD;  Location: [place];  Service: ENT;  Laterality: N/A;  . SUBMUCOSAL LIFTING INJECTION  04/12/2018   Procedure: SUBMUCOSAL LIFTING INJECTION;  Surgeon: Irving Copas., MD;  Location: [place];  Service: Gastroenterology;;  . Lia Foyer TATTOO INJECTION  04/12/2018   Procedure: SUBMUCOSAL TATTOO INJECTION;  Surgeon: Irving Copas., MD;  Location: [place];  Service: Gastroenterology;;  . TONSILLECTOMY    . TURBINATE REDUCTION Bilateral 05/16/2014   Procedure: BILATERAL TURBINATE REDUCTION;  Surgeon: Leta Baptist, MD;  Location: [place];  Service: ENT;  Laterality: Bilateral;  . UPPER GI ENDOSCOPY     In [place] in 2012-2013   Family History  Problem Relation Age of Onset  . Colon cancer Neg Hx   . Esophageal cancer Neg Hx   . Breast cancer Neg Hx   . Stomach cancer Neg Hx    Social History   Tobacco Use  . Smoking status: Never Smoker  . Smokeless tobacco: Never Used  Substance Use Topics  . Alcohol use: Yes    Comment: occ  . Drug use: No   Current Outpatient Medications  Medication Sig Dispense Refill  . budesonide (RHINOCORT ALLERGY) 32 MCG/ACT nasal spray Place 1 spray into both nostrils daily.    Marland Kitchen levocetirizine (XYZAL) 5 MG tablet Take 5 mg by mouth as needed for allergies.     Marland Kitchen omeprazole (PRILOSEC) 40 MG capsule Take 1 capsule (40 mg total) by mouth 2 (two) times daily. (Patient taking differently: Take 40 mg by mouth 2 (two) times daily as needed (acid reflux). ) 60 capsule 3  . Pseudoephedrine-guaiFENesin (MUCINEX D PO) Take 1 tablet by mouth as needed.    Marland Kitchen spironolactone (ALDACTONE) 100 MG tablet TAKE 1 TABLET BY MOUTH EVERY DAY 90 tablet 3  . sucralfate (CARAFATE) 1 g tablet Take 1 tablet (1 g total) by mouth 4 (four) times daily. (Patient taking differently: Take 1 g by mouth 4 (four) times daily as needed  (stomach issues). ) 120 tablet 0  . traZODone (DESYREL) 50 MG tablet Take 50 mg by mouth at bedtime.    . tretinoin (RETIN-A) 0.1 % cream Apply topically 2 (two) times daily. (Patient taking differently: Apply 1 application topically as needed (skin irritation). ) 45 g 12  . valACYclovir (VALTREX) 500 MG tablet Take 500 mg by mouth daily.     No current facility-administered medications for this visit.    Allergies  Allergen Reactions  . Aspirin Shortness Of Breath and Other (See Comments)    wheeze wheeze Overall sinus related problems patient reported  . Ibuprofen Shortness Of Breath    Wheezing  Wheezing  . Sulfa Antibiotics Hives  . Sulfasalazine Hives     Review of Systems: All systems reviewed and negative except where noted in HPI.     Physical Exam:    Physical exam not completed due to the nature of this telehealth communication.  Patient was otherwise alert and oriented and well communicative.   ASSESSMENT AND PLAN;   1) GERD 2) History of Nissen fundoplication 3) History of hiatal hernia 4) Dyspepsia  History of hiatal hernia and reflux, corrected by lap hernia repair and Nissen fundoplication  in [facility] approximately 2013.  Had been doing well, but return of index symptoms.  Repeat EGD in 02/2018 confirmed loose wrap.  Discussed TIF again at length today, and she would like to proceed when elective cases resume after COVID-19. - Obtain prior records from [place] to review previous endoscopy and surgical reports -Resume Prilosec for now -Resume antireflux lifestyle measures -Of note, she had typical reflux symptoms along with atypical symptoms of dyspepsia.  The dyspepsia was most responsive to the fundoplication, and most bothersome now since rapid loosening.  Otherwise heartburn and regurgitation pretty well controlled when taking Prilosec -Of note, since she is planning on repeat EUS for biopsies and/or resection of duodenal intramural nodule in approximately  September/October, my recommendation was to perform TIF after that date, which she agrees  5) Abdominal bloating/distention: 6) Abdominal cramping:  Clinical symptoms seem consistent with small intestine bacterial overgrowth. -Rifaximin 550 mg p.o. twice daily for diagnostic and therapeutic intent x14 days - Take probiotic, such as Align, x3 weeks while taking rifaximin -If unable to obtain rifaximin, may need to consider alternate antibiotic course -Levsin 0.125 mg as needed every 6 hours for abdominal pain/cramping -Gas-X okay as needed -Can consider trial of FD guard  7) Duodenal nodule: Intramural nodule noted on EUS.  Tunnel biopsies benign.  Dr. Rush Landmark planning EUS in 09/2018 with either repeat biopsies versus resection.  Will delay TIF pending that procedure.   -RTC in 3 to 6 months or sooner as needed   Lavena Bullion, DO, FACG  06/16/2018, 2:45 PM   Gregor Hams, MD Mansouraty, Valarie Merino, MD

## 2018-08-30 ENCOUNTER — Ambulatory Visit: Payer: 59 | Admitting: Family Medicine

## 2018-08-30 ENCOUNTER — Other Ambulatory Visit: Payer: Self-pay

## 2018-08-30 ENCOUNTER — Telehealth: Payer: Self-pay | Admitting: Family Medicine

## 2018-08-30 NOTE — Telephone Encounter (Signed)
Received documentation from [facility].  As vaginal irritation and menopausal symptoms.  After discussion will use estrogen patch for menopausal symptoms.  BV panel was negative.

## 2018-09-01 ENCOUNTER — Other Ambulatory Visit: Payer: Self-pay

## 2018-09-01 ENCOUNTER — Encounter: Payer: Self-pay | Admitting: Family Medicine

## 2018-09-01 ENCOUNTER — Ambulatory Visit (INDEPENDENT_AMBULATORY_CARE_PROVIDER_SITE_OTHER): Payer: Managed Care, Other (non HMO) | Admitting: Family Medicine

## 2018-09-01 VITALS — BP 118/60 | HR 94 | Temp 98.4°F | Wt 152.0 lb

## 2018-09-01 DIAGNOSIS — M76899 Other specified enthesopathies of unspecified lower limb, excluding foot: Secondary | ICD-10-CM

## 2018-09-01 MED ORDER — DICLOFENAC SODIUM 1 % TD GEL: 4.0000 g | Freq: Four times a day (QID) | TRANSDERMAL | 11 refills | Status: DC

## 2018-09-01 NOTE — Patient Instructions (Addendum)
.  Thank you for coming in today. Call or go to the ER if you develop a large red swollen joint with extreme pain or oozing puss.  Continue exercises.  Use the diclofenac gel.  Because you are allergic to ibuprofen do a small test on your forearm first to make sure you will not develop a rash.  Use cushion.  If not improved let me know.

## 2018-09-01 NOTE — Progress Notes (Signed)
Jean Kemp is a 48 y.o. female who presents to [place] today for left hamstring pain.  Patient has had pain in her left buttocks and posterior groin off and on for years.  When she was in fifth grade running track she suffered an apophysis hamstring avulsion on the left side.  She notes that she is always had some pain in this area since.  She is had subsequent work-up and found to have a effectively avulsion fracture that never fully healed in this area.  She has been able to get by with stretching and tennis ball rolling.  She is even had x-rays and trials of physical therapy with only a little bit of benefit.  She notes pain with prolonged sitting.  She notes her pain is been worse a little more recently because she has been doing a lot more sitting with working from home.  She denies any radiating pain weakness or numbness fevers or chills.  ROS:  As above  Exam:  BP 118/60   Pulse 94   Temp 98.4 F (36.9 C) (Oral)   Wt 152 lb (68.9 kg)   BMI 26.09 kg/m  Wt Readings from Last 5 Encounters:  09/01/18 152 lb (68.9 kg)  06/16/18 135 lb (61.2 kg)  04/12/18 138 lb 14.2 oz (63 kg)  03/24/18 140 lb (63.5 kg)  03/12/18 140 lb 2 oz (63.6 kg)   General: Well Developed, well nourished, and in no acute distress.  Neuro/Psych: Alert and oriented x3, extra-ocular muscles intact, able to move all 4 extremities, sensation grossly intact. Skin: Warm and dry, no rashes noted.  Respiratory: Not using accessory muscles, speaking in full sentences, trachea midline.  Cardiovascular: Pulses palpable, no extremity edema. Abdomen: Does not appear distended. MSK: Left hip normal-appearing normal motion.  Tender to palpation mildly at medial portion of hamstring insertion onto ischial tuberosity. Normal hip motion and strength.   Lab and Radiology Results Limited musculoskeletal ultrasound of left hamstring.  Normal-appearing muscle  belly and tendon.  At insertion onto initial tuberosity she has a small piece of hyperechoic bone just proximal to the full insertion.  She effectively has a pseudoarthrosis with a small effusion.  The middle portion of this is tender to palpation with small effusion on ultrasound. No significant blood flow on Doppler.   Procedure: Real-time Ultrasound Guided Injection of left hamstring origin issue tuberosity Device: GE Logiq E   Images permanently stored and available for review in the ultrasound unit. Verbal informed consent obtained.  Discussed risks and benefits of procedure. Warned about infection bleeding damage to structures skin hypopigmentation and fat atrophy among others. Patient expresses understanding and agreement Time-out conducted.   Noted no overlying erythema, induration, or other signs of local infection.   Skin prepped in a sterile fashion.   Local anesthesia: Topical Ethyl chloride.   With sterile technique and under real time ultrasound guidance:  40 mg of Depo-Medrol and 1.5 mL of lidocaine injected easily.   Completed without difficulty   Pain immediately resolved suggesting accurate placement of the medication.   Advised to call if fevers/chills, erythema, induration, drainage, or persistent bleeding.   Images permanently stored and available for review in the ultrasound unit.  Impression: Technically successful ultrasound guided injection.         Assessment and Plan: 48 y.o. female with left hamstring origin pain.  Patient suffered an apophyseal avulsion injury years ago.  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  She has failed typical conservative management.  After discussion plan to treat with injection as above as well as trial of diclofenac gel.  Additionally work on home exercise program and strengthening.  Recheck as needed.   PDMP not reviewed this encounter. No orders of the  defined types were placed in this encounter.  Meds ordered this encounter  Medications  . diclofenac sodium (VOLTAREN) 1 % GEL    Sig: Apply 4 g topically 4 (four) times daily. To affected joint.    Dispense:  100 g    Refill:  11    Historical information moved to improve visibility of documentation.  Past Medical History:  Diagnosis Date  . Asthma   . GERD (gastroesophageal reflux disease)   . Headache    sinus  . Heart murmur    mvp-never had echo  . History of hiatal hernia    had Nissen Fundoplication  . Hx of hiatal hernia 06/29/2017  . Hypertension   . Mixed hyperlipidemia 02/18/2017  . Multiple allergies   . Vitamin D deficiency 02/18/2017   Past Surgical History:  Procedure Laterality Date  . ABDOMINAL HYSTERECTOMY    . BIOPSY  04/12/2018   Procedure: BIOPSY;  Surgeon: Rush Landmark Telford Nab., MD;  Location: [place];  Service: Gastroenterology;;  . COLONOSCOPY    . ESOPHAGOGASTRODUODENOSCOPY (EGD) WITH PROPOFOL N/A 04/12/2018   Procedure: ESOPHAGOGASTRODUODENOSCOPY (EGD) WITH PROPOFOL;  Surgeon: Rush Landmark Telford Nab., MD;  Location: [place];  Service: Gastroenterology;  Laterality: N/A;  . EUS N/A 04/12/2018   Procedure: UPPER ENDOSCOPIC ULTRASOUND (EUS) RADIAL;  Surgeon: Rush Landmark Telford Nab., MD;  Location: [place];  Service: Gastroenterology;  Laterality: N/A;  . HERNIA REPAIR     abdominal hernia   . HIATAL HERNIA REPAIR    . NISSEN FUNDOPLICATION    . SEPTOPLASTY N/A 05/16/2014   Procedure: SEPTOPLASTY;  Surgeon: Leta Baptist, MD;  Location: [place];  Service: ENT;  Laterality: N/A;  . SUBMUCOSAL LIFTING INJECTION  04/12/2018   Procedure: SUBMUCOSAL LIFTING INJECTION;  Surgeon: Irving Copas., MD;  Location: [place];  Service: Gastroenterology;;  . Lia Foyer TATTOO INJECTION  04/12/2018   Procedure: SUBMUCOSAL TATTOO INJECTION;  Surgeon: Irving Copas., MD;  Location: [place];  Service: Gastroenterology;;   . TONSILLECTOMY    . TURBINATE REDUCTION Bilateral 05/16/2014   Procedure: BILATERAL TURBINATE REDUCTION;  Surgeon: Leta Baptist, MD;  Location: [place];  Service: ENT;  Laterality: Bilateral;  . UPPER GI ENDOSCOPY     In [place] in 2012-2013   Social History   Tobacco Use  . Smoking status: Never Smoker  . Smokeless tobacco: Never Used  Substance Use Topics  . Alcohol use: Yes    Comment: occ   family history is not on file.  Medications: Current Outpatient Medications  Medication Sig Dispense Refill  . bifidobacterium infantis (ALIGN) capsule Take 1 capsule by mouth daily. 30 capsule 6  . budesonide (RHINOCORT ALLERGY) 32 MCG/ACT nasal spray Place 1 spray into both nostrils daily.    . diclofenac sodium (VOLTAREN) 1 % GEL Apply 4 g topically 4 (four) times daily. To affected joint. 100 g 11  . estradiol (VIVELLE-DOT) 0.05 MG/24HR patch APPLY 1 PATCH TWICE A WEEK    . hyoscyamine (LEVSIN SL) 0.125 MG SL tablet Place 1 tablet (0.125 mg total) under the tongue every 6 (six) hours as needed. 120 tablet 2  . levocetirizine (XYZAL) 5 MG tablet  Take 5 mg by mouth as needed for allergies.     Marland Kitchen omeprazole (PRILOSEC) 40 MG capsule Take 1 capsule (40 mg total) by mouth 2 (two) times daily. (Patient taking differently: Take 40 mg by mouth 2 (two) times daily as needed (acid reflux). ) 60 capsule 3  . Pseudoephedrine-guaiFENesin (MUCINEX D PO) Take 1 tablet by mouth as needed.    . rifaximin (XIFAXAN) 550 MG TABS tablet Take 1 tablet (550 mg total) by mouth 2 (two) times daily. For 14 days. 28 tablet 0  . spironolactone (ALDACTONE) 100 MG tablet TAKE 1 TABLET BY MOUTH EVERY DAY 90 tablet 3  . sucralfate (CARAFATE) 1 g tablet Take 1 tablet (1 g total) by mouth 4 (four) times daily. (Patient taking differently: Take 1 g by mouth 4 (four) times daily as needed (stomach issues). ) 120 tablet 0  . traZODone (DESYREL) 50 MG tablet Take 50 mg by mouth at bedtime.    . tretinoin (RETIN-A)  0.1 % cream Apply topically 2 (two) times daily. (Patient taking differently: Apply 1 application topically as needed (skin irritation). ) 45 g 12  . valACYclovir (VALTREX) 500 MG tablet Take 500 mg by mouth daily.    Merril Abbe 10 MCG TABS vaginal tablet INSERT 1 TABLET VAGINALLY 2 TIMES A WEEK     No current facility-administered medications for this visit.    Allergies  Allergen Reactions  . Aspirin Shortness Of Breath and Other (See Comments)    wheeze wheeze Overall sinus related problems patient reported  . Ibuprofen Shortness Of Breath    Wheezing  Wheezing  . Sulfa Antibiotics Hives  . Sulfasalazine Hives      Discussed warning signs or symptoms. Please see discharge instructions. Patient expresses understanding.

## 2018-09-02 DIAGNOSIS — M76899 Other specified enthesopathies of unspecified lower limb, excluding foot: Secondary | ICD-10-CM | POA: Insufficient documentation

## 2018-09-11 ENCOUNTER — Other Ambulatory Visit: Payer: Self-pay | Admitting: Gastroenterology

## 2018-09-17 ENCOUNTER — Encounter: Payer: Self-pay | Admitting: Family Medicine

## 2018-09-21 NOTE — Telephone Encounter (Signed)
Appointment has been made for 08/26 wednesday at 11:30am. I did verify with triage and we do have PRP kits. Also I set one aside for patient, placed her name on it. No further questions at this time.

## 2018-09-22 ENCOUNTER — Other Ambulatory Visit: Payer: Self-pay

## 2018-09-22 ENCOUNTER — Ambulatory Visit (INDEPENDENT_AMBULATORY_CARE_PROVIDER_SITE_OTHER): Payer: Managed Care, Other (non HMO) | Admitting: Family Medicine

## 2018-09-22 ENCOUNTER — Encounter: Payer: Self-pay | Admitting: Family Medicine

## 2018-09-22 VITALS — BP 116/81 | HR 89 | Temp 98.1°F | Wt 147.0 lb

## 2018-09-22 DIAGNOSIS — M76899 Other specified enthesopathies of unspecified lower limb, excluding foot: Secondary | ICD-10-CM

## 2018-09-22 NOTE — Patient Instructions (Signed)
Thank you for coming in today. Ok to use tylenol for pain  If you need more pain medicine I can prescribe stronger medicine.  Start rehab exercises in about 1 week.   The [place] L Protocol for Hamstring Strains  Lyman Bishop PT  Let me know how you do.   Call or go to the ER if you develop a large red swollen joint with extreme pain or oozing puss.

## 2018-09-22 NOTE — Progress Notes (Signed)
Jean Kemp presents to clinic today for dedicated scheduled PRP injection left hamstring insertion initial tuberosity  Consent obtained and timeout performed.  PRP collected and concentrated using PEAK 44ml system.   Procedure: Real-time Ultrasound Guided Injection of left ischial tuberosity hamstring insertion Device: GE Logiq E   Images permanently stored and available for review in the ultrasound unit. Verbal informed consent obtained.  Discussed risks and benefits of procedure. Warned about infection bleeding damage to structures skin hypopigmentation and fat atrophy among others. Patient expresses understanding and agreement Time-out conducted.   Noted no overlying erythema, induration, or other signs of local infection.   Skin prepped in a sterile fashion.   Local anesthesia: Topical Ethyl chloride.   With sterile technique and under real time ultrasound guidance:  7 mL of Marcaine injected easily.   Completed without difficulty   Pain immediately resolved suggesting accurate placement of the medication.   Then skin was again sterilized and 3.5 mL of PRP injected Advised to call if fevers/chills, erythema, induration, drainage, or persistent bleeding.   Images permanently stored and available for review in the ultrasound unit.  Impression: Technically successful ultrasound guided injection.

## 2018-11-05 ENCOUNTER — Telehealth: Payer: Self-pay | Admitting: Gastroenterology

## 2018-11-05 NOTE — Telephone Encounter (Signed)
Pt called to schedule recall EUS with Dr. Rush Landmark.

## 2018-11-09 ENCOUNTER — Other Ambulatory Visit: Payer: Self-pay

## 2018-11-09 DIAGNOSIS — K3189 Other diseases of stomach and duodenum: Secondary | ICD-10-CM

## 2018-11-09 NOTE — Telephone Encounter (Signed)
The pt has been scheduled for eus on 11/4 and COVID test on 10/31.  Pt instructed and information sent to pt via My Chart

## 2018-11-27 ENCOUNTER — Other Ambulatory Visit (HOSPITAL_COMMUNITY)
Admission: RE | Admit: 2018-11-27 | Discharge: 2018-11-27 | Disposition: A | Discharge status: Home / Self Care | Payer: 59 | Source: Ambulatory Visit | Attending: Gastroenterology | Admitting: Gastroenterology

## 2018-11-27 DIAGNOSIS — Z20828 Contact with and (suspected) exposure to other viral communicable diseases: Secondary | ICD-10-CM | POA: Insufficient documentation

## 2018-11-27 DIAGNOSIS — Z01812 Encounter for preprocedural laboratory examination: Secondary | ICD-10-CM | POA: Insufficient documentation

## 2018-11-28 LAB — NOVEL CORONAVIRUS, NAA (HOSP ORDER, SEND-OUT TO REF LAB; TAT 18-24 HRS): SARS-CoV-2, NAA: NOT DETECTED

## 2018-11-30 ENCOUNTER — Encounter (HOSPITAL_COMMUNITY): Payer: Self-pay | Admitting: *Deleted

## 2018-11-30 ENCOUNTER — Other Ambulatory Visit: Payer: Self-pay

## 2018-11-30 NOTE — Progress Notes (Signed)
Pt denies SOB, chest pain, and being under the care of a cardiologist. Pt denies having a stress test, echo and cardiac cath. Pt denies having an EKG and chest x ray in the last year. Pt denies recent labs. Pt made aware to stop taking Aspirin (unless otherwise advised by surgeon), vitamins, fish oil and herbal medications. Do not take any NSAIDs ie: Ibuprofen, Advil, Naproxen (Aleve), Motrin, BC and Goody Powder. Pt verbalized understanding of all pre-op instructions.

## 2018-12-01 ENCOUNTER — Encounter (HOSPITAL_COMMUNITY)
Admission: RE | Disposition: A | Discharge status: Home / Self Care | Payer: Self-pay | Source: Home / Self Care | Attending: Gastroenterology

## 2018-12-01 ENCOUNTER — Ambulatory Visit (HOSPITAL_COMMUNITY): Payer: 59 | Admitting: Certified Registered"

## 2018-12-01 ENCOUNTER — Ambulatory Visit (HOSPITAL_COMMUNITY)
Admission: RE | Admit: 2018-12-01 | Discharge: 2018-12-01 | Disposition: A | Discharge status: Home / Self Care | Payer: 59 | Attending: Gastroenterology | Admitting: Gastroenterology

## 2018-12-01 ENCOUNTER — Encounter (HOSPITAL_COMMUNITY): Payer: Self-pay | Admitting: Certified Registered"

## 2018-12-01 DIAGNOSIS — K225 Diverticulum of esophagus, acquired: Secondary | ICD-10-CM | POA: Diagnosis not present

## 2018-12-01 DIAGNOSIS — K219 Gastro-esophageal reflux disease without esophagitis: Secondary | ICD-10-CM | POA: Diagnosis not present

## 2018-12-01 DIAGNOSIS — E782 Mixed hyperlipidemia: Secondary | ICD-10-CM | POA: Insufficient documentation

## 2018-12-01 DIAGNOSIS — Q396 Congenital diverticulum of esophagus: Secondary | ICD-10-CM | POA: Diagnosis not present

## 2018-12-01 DIAGNOSIS — K2980 Duodenitis without bleeding: Secondary | ICD-10-CM | POA: Diagnosis not present

## 2018-12-01 DIAGNOSIS — I899 Noninfective disorder of lymphatic vessels and lymph nodes, unspecified: Secondary | ICD-10-CM

## 2018-12-01 DIAGNOSIS — K3189 Other diseases of stomach and duodenum: Secondary | ICD-10-CM

## 2018-12-01 DIAGNOSIS — I10 Essential (primary) hypertension: Secondary | ICD-10-CM | POA: Insufficient documentation

## 2018-12-01 DIAGNOSIS — J45909 Unspecified asthma, uncomplicated: Secondary | ICD-10-CM | POA: Diagnosis not present

## 2018-12-01 HISTORY — PX: EUS: SHX5427

## 2018-12-01 HISTORY — PX: ESOPHAGOGASTRODUODENOSCOPY (EGD) WITH PROPOFOL: SHX5813

## 2018-12-01 HISTORY — PX: HEMOSTASIS CLIP PLACEMENT: SHX6857

## 2018-12-01 HISTORY — PX: SUBMUCOSAL LIFTING INJECTION: SHX6855

## 2018-12-01 HISTORY — PX: ENDOSCOPIC MUCOSAL RESECTION: SHX6839

## 2018-12-01 HISTORY — DX: Other complications of anesthesia, initial encounter: T88.59XA

## 2018-12-01 SURGERY — ESOPHAGOGASTRODUODENOSCOPY (EGD) WITH PROPOFOL
Anesthesia: Monitor Anesthesia Care

## 2018-12-01 MED ORDER — SODIUM CHLORIDE 0.9 % IV SOLN: INTRAVENOUS | Status: DC

## 2018-12-01 MED ORDER — OMEPRAZOLE 40 MG PO CPDR: 40.0000 mg | DELAYED_RELEASE_CAPSULE | Freq: Two times a day (BID) | ORAL | 0 refills | Status: DC

## 2018-12-01 MED ORDER — PROPOFOL 10 MG/ML IV BOLUS
INTRAVENOUS | Status: DC | PRN
  Administered 2018-12-01: 50 mg via INTRAVENOUS
  Administered 2018-12-01: 70 mg via INTRAVENOUS
  Administered 2018-12-01: 30 mg via INTRAVENOUS
  Administered 2018-12-01: 50 mg via INTRAVENOUS
  Administered 2018-12-01: 100 mg via INTRAVENOUS

## 2018-12-01 MED ORDER — LACTATED RINGERS IV SOLN
INTRAVENOUS | Status: DC | PRN
  Administered 2018-12-01: 10:00:00 via INTRAVENOUS

## 2018-12-01 MED ORDER — GLYCOPYRROLATE PF 0.2 MG/ML IJ SOSY
PREFILLED_SYRINGE | INTRAMUSCULAR | Status: DC | PRN
  Administered 2018-12-01: .1 mg via INTRAVENOUS

## 2018-12-01 MED ORDER — OMEPRAZOLE 40 MG PO CPDR: 40.0000 mg | DELAYED_RELEASE_CAPSULE | Freq: Every day | ORAL | 0 refills | Status: DC

## 2018-12-01 MED ORDER — SODIUM CHLORIDE BACTERIOSTATIC 0.9 % IJ SOLN
INTRAMUSCULAR | Status: DC | PRN
  Administered 2018-12-01: 5 mL via SUBMUCOSAL

## 2018-12-01 MED ORDER — FENTANYL CITRATE (PF) 100 MCG/2ML IJ SOLN
INTRAMUSCULAR | Status: DC | PRN
  Administered 2018-12-01 (×2): 50 ug via INTRAVENOUS

## 2018-12-01 MED ORDER — PROPOFOL 500 MG/50ML IV EMUL
INTRAVENOUS | Status: DC | PRN
  Administered 2018-12-01: 125 ug/kg/min via INTRAVENOUS

## 2018-12-01 MED ORDER — ONDANSETRON HCL 4 MG/2ML IJ SOLN
INTRAMUSCULAR | Status: DC | PRN
  Administered 2018-12-01: 4 mg via INTRAVENOUS

## 2018-12-01 MED ORDER — LACTATED RINGERS IV SOLN
INTRAVENOUS | Status: AC | PRN
  Administered 2018-12-01: 1000 mL via INTRAVENOUS

## 2018-12-01 MED ORDER — LIDOCAINE 2% (20 MG/ML) 5 ML SYRINGE
INTRAMUSCULAR | Status: DC | PRN
  Administered 2018-12-01 (×2): 50 mg via INTRAVENOUS

## 2018-12-01 MED ORDER — MIDAZOLAM HCL 2 MG/2ML IJ SOLN
INTRAMUSCULAR | Status: DC | PRN
  Administered 2018-12-01 (×2): 1 mg via INTRAVENOUS

## 2018-12-01 SURGICAL SUPPLY — 15 items

## 2018-12-01 NOTE — Discharge Instructions (Signed)
YOU HAD AN ENDOSCOPIC PROCEDURE TODAY: Refer to the procedure report and other information in the discharge instructions given to you for any specific questions about what was found during the examination. If this information does not answer your questions, please call [place] office at [phone] to clarify.   YOU SHOULD EXPECT: Some feelings of bloating in the abdomen. Passage of more gas than usual. Walking can help get rid of the air that was put into your GI tract during the procedure and reduce the bloating. If you had a lower endoscopy (such as a colonoscopy or flexible sigmoidoscopy) you may notice spotting of blood in your stool or on the toilet paper. Some abdominal soreness may be present for a day or two, also.  DIET: Please consume only clear liquids today. You may resume your normal diet tomorrow (12/02/2018). Drink plenty of fluids but you should avoid alcoholic beverages for 24 hours. If you had a esophageal dilation, please see attached instructions for diet.    ACTIVITY: Your care partner should take you home directly after the procedure. You should plan to take it easy, moving slowly for the rest of the day. You can resume normal activity the day after the procedure however YOU SHOULD NOT DRIVE, use power tools, machinery or perform tasks that involve climbing or major physical exertion for 24 hours (because of the sedation medicines used during the test).   SYMPTOMS TO REPORT IMMEDIATELY: A gastroenterologist can be reached at any hour. Please call [phone]  for any of the following symptoms:   Following upper endoscopy (EGD, EUS, ERCP, esophageal dilation) Vomiting of blood or coffee ground material  New, significant abdominal pain  New, significant chest pain or pain under the shoulder blades  Painful or persistently difficult swallowing  New shortness of breath  Black, tarry-looking or red, bloody stools  FOLLOW UP:  If any biopsies were taken you will be contacted  by phone or by letter within the next 1-3 weeks. Call [phone]  if you have not heard about the biopsies in 3 weeks.  Please also call with any specific questions about appointments or follow up tests.   Clear Liquid Diet, Adult A clear liquid diet is a diet that includes only liquids and semi-liquids that you can see through. You do not eat any food on this diet. Most people need to follow this diet for only a short time. You may need to follow a clear liquid diet if:  You have a problem right before or after you have surgery.  You did not eat food for a long time.  You had any of these: ? Feeling sick to your stomach (nausea). ? Throwing up (vomiting). ? Passing a watery stool (diarrhea).  You are going to have an exam to look at parts of your digestive system.  You are going to have bowel surgery. The goals of this diet are:  To rest the stomach.  To help you clear the digestive system before an exam.  To make sure that there is enough fluid in your body.  To make sure you get some energy.  To help you get back to eating like you used to. What are tips for following this plan?  A clear liquid is a liquid or semi-liquid that you can see through when you hold it up to a light. An example of this is gelatin.  This diet does not give you all the nutrients that you need. Choose a variety of the liquids that your  doctor says you can drink on this diet. That way, you will get as many nutrients as possible.  If you are not sure whether you can have certain items, ask your doctor. If you are unable to swallow a thin liquid, you will need to thicken it before taking it. This will stop you from breathing it in (aspiration). What foods should I eat?   Water and flavored water.  Fruit juices that do not have pulp, such as cranberry juice and apple juice.  Tea and coffee without milk or cream.  Clear bouillon or broth.  Broth-based soups that have been strained.  Flavored  gelatins.  Honey.  Sugar water.  Ice or frozen ice pops that do not have any milk, yogurt, fruit pieces, or fruit pulp in them.  Clear sodas.  Clear sports drinks. The items listed above may not be a complete list of what you can eat and drink. Contact a dietitian for more options. What foods should I avoid?  Juices that have pulp.  Milk.  Cream or cream-based soups.  Yogurt.  Normal foods that are not clear liquids or semi-liquids. The items listed above may not be a complete list of what you should not eat and drink. Contact a dietitian for options. Questions to ask your health care provider  How long do I need to follow this diet?  Are there any medicines that I should change while on this diet? Summary  A clear liquid diet is a diet that includes only liquids and semi-liquids that you can see through.  Some goals of this diet are to rest your stomach, make sure you get enough fluid, and give you some energy.  Avoid liquids with milk, cream, or pulp while you are on this diet. This information is not intended to replace advice given to you by your health care provider. Make sure you discuss any questions you have with your health care provider. Document Released: 12/27/2007 Document Revised: 07/06/2017 Document Reviewed: 07/06/2017 Elsevier Patient Education  2020 [employer].

## 2018-12-01 NOTE — H&P (Signed)
GASTROENTEROLOGY PROCEDURE H&P NOTE   Primary Care Physician: Gregor Hams, MD  HPI: Jean Kemp is a 48 y.o. female who presents for EGD/EUS with EMR.  Past Medical History:  Diagnosis Date  . Asthma   . Complication of anesthesia    woke up during Endo procedure per pt/ per MD  . GERD (gastroesophageal reflux disease)   . Headache    sinus  . Heart murmur    mvp-never had echo  . History of hiatal hernia    had Nissen Fundoplication  . Hx of hiatal hernia 06/29/2017  . Hypertension   . Mixed hyperlipidemia 02/18/2017  . Multiple allergies   . Vitamin D deficiency 02/18/2017   Past Surgical History:  Procedure Laterality Date  . ABDOMINAL HYSTERECTOMY    . BIOPSY  04/12/2018   Procedure: BIOPSY;  Surgeon: Rush Landmark Telford Nab., MD;  Location: [place];  Service: Gastroenterology;;  . COLONOSCOPY    . ESOPHAGOGASTRODUODENOSCOPY (EGD) WITH PROPOFOL N/A 04/12/2018   Procedure: ESOPHAGOGASTRODUODENOSCOPY (EGD) WITH PROPOFOL;  Surgeon: Rush Landmark Telford Nab., MD;  Location: [place];  Service: Gastroenterology;  Laterality: N/A;  . EUS N/A 04/12/2018   Procedure: UPPER ENDOSCOPIC ULTRASOUND (EUS) RADIAL;  Surgeon: Rush Landmark Telford Nab., MD;  Location: [place];  Service: Gastroenterology;  Laterality: N/A;  . HERNIA REPAIR     abdominal hernia   . HIATAL HERNIA REPAIR    . NISSEN FUNDOPLICATION    . SEPTOPLASTY N/A 05/16/2014   Procedure: SEPTOPLASTY;  Surgeon: Leta Baptist, MD;  Location: [place];  Service: ENT;  Laterality: N/A;  . SUBMUCOSAL LIFTING INJECTION  04/12/2018   Procedure: SUBMUCOSAL LIFTING INJECTION;  Surgeon: Irving Copas., MD;  Location: [place];  Service: Gastroenterology;;  . Lia Foyer TATTOO INJECTION  04/12/2018   Procedure: SUBMUCOSAL TATTOO INJECTION;  Surgeon: Irving Copas., MD;  Location: [place];  Service: Gastroenterology;;  . TONSILLECTOMY    . TURBINATE REDUCTION Bilateral  05/16/2014   Procedure: BILATERAL TURBINATE REDUCTION;  Surgeon: Leta Baptist, MD;  Location: [place];  Service: ENT;  Laterality: Bilateral;  . UPPER GI ENDOSCOPY     In [place] in 2012-2013   Current Facility-Administered Medications  Medication Dose Route Frequency Provider Last Rate Last Dose  . 0.9 %  sodium chloride infusion   Intravenous Continuous Mansouraty, Telford Nab., MD       Allergies  Allergen Reactions  . Aspirin Shortness Of Breath and Other (See Comments)    wheeze wheeze Overall sinus related problems patient reported  . Ibuprofen Shortness Of Breath    Wheezing  Wheezing  . Sulfa Antibiotics Hives  . Sulfasalazine Hives   Family History  Problem Relation Age of Onset  . Colon cancer Neg Hx   . Esophageal cancer Neg Hx   . Breast cancer Neg Hx   . Stomach cancer Neg Hx    Social History   Socioeconomic History  . Marital status: Married    Spouse name: Not on file  . Number of children: 2  . Years of education: Not on file  . Highest education level: Not on file  Occupational History  . Occupation: attorney  Social Needs  . Financial resource strain: Not on file  . Food insecurity    Worry: Not on file    Inability: Not on file  . Transportation needs    Medical: Not on file    Non-medical: Not on file  Tobacco Use  . Smoking status: Never Smoker  .  Smokeless tobacco: Never Used  Substance and Sexual Activity  . Alcohol use: Yes    Comment: occ  . Drug use: No  . Sexual activity: Not on file  Lifestyle  . Physical activity    Days per week: Not on file    Minutes per session: Not on file  . Stress: Not on file  Relationships  . Social Herbalist on phone: Not on file    Gets together: Not on file    Attends religious service: Not on file    Active member of club or organization: Not on file    Attends meetings of clubs or organizations: Not on file    Relationship status: Not on file  . Intimate partner  violence    Fear of current or ex partner: Not on file    Emotionally abused: Not on file    Physically abused: Not on file    Forced sexual activity: Not on file  Other Topics Concern  . Not on file  Social History Narrative  . Not on file    Physical Exam: Vital signs in last 24 hours: Temp:  [98.3 F (36.8 C)] 98.3 F (36.8 C) (11/04 0903) Pulse Rate:  [85] 85 (11/04 0903) Resp:  [15] 15 (11/04 0903) BP: (125)/(75) 125/75 (11/04 0903) SpO2:  [100 %] 100 % (11/04 0903) Weight:  [63.5 kg] 63.5 kg (11/04 0904)   GEN: NAD EYE: Sclerae anicteric ENT: MMM CV: Non-tachycardic GI: Soft, NT/ND NEURO:  Alert & Oriented x 3  Lab Results: No results for input(s): WBC, HGB, HCT, PLT in the last 72 hours. BMET No results for input(s): NA, K, CL, CO2, GLUCOSE, BUN, CREATININE, CALCIUM in the last 72 hours. LFT No results for input(s): PROT, ALBUMIN, AST, ALT, ALKPHOS, BILITOT, BILIDIR, IBILI in the last 72 hours. PT/INR No results for input(s): LABPROT, INR in the last 72 hours.   Impression / Plan: This is a 48 y.o.female who presents for EGD/EUS with EMR.  The risks and benefits of endoscopic evaluation were discussed with the patient; these include but are not limited to the risk of perforation, infection, bleeding, missed lesions, lack of diagnosis, severe illness requiring hospitalization, as well as anesthesia and sedation related illnesses.  The patient is agreeable to proceed.    Justice Britain, MD [facility] Office # [phone]

## 2018-12-01 NOTE — Anesthesia Procedure Notes (Signed)
Procedure Name: MAC Date/Time: 12/01/2018 9:53 AM Performed by: Orlie Dakin, CRNA Pre-anesthesia Checklist: Patient identified, Emergency Drugs available, Suction available and Patient being monitored Patient Re-evaluated:Patient Re-evaluated prior to induction Preoxygenation: Pre-oxygenation with 100% oxygen Induction Type: IV induction

## 2018-12-01 NOTE — Anesthesia Preprocedure Evaluation (Signed)
Anesthesia Evaluation  Patient identified by MRN, date of birth, ID band Patient awake    Reviewed: Allergy & Precautions, NPO status , Patient's Chart, lab work & pertinent test results  Airway Mallampati: I  TM Distance: >3 FB Neck ROM: Full    Dental no notable dental hx. (+) Teeth Intact   Pulmonary asthma ,    Pulmonary exam normal breath sounds clear to auscultation       Cardiovascular hypertension, Pt. on medications Normal cardiovascular exam+ Valvular Problems/Murmurs  Rhythm:Regular Rate:Normal     Neuro/Psych  Headaches, negative psych ROS   GI/Hepatic Neg liver ROS, hiatal hernia, GERD  Medicated and Controlled,Duodenal mass   Endo/Other  negative endocrine ROS  Renal/GU negative Renal ROS  negative genitourinary   Musculoskeletal negative musculoskeletal ROS (+)   Abdominal   Peds  Hematology negative hematology ROS (+)   Anesthesia Other Findings   Reproductive/Obstetrics                             Anesthesia Physical  Anesthesia Plan  ASA: II  Anesthesia Plan: MAC   Post-op Pain Management:    Induction: Intravenous  PONV Risk Score and Plan: 2  Airway Management Planned: Natural Airway and Nasal Cannula  Additional Equipment:   Intra-op Plan:   Post-operative Plan:   Informed Consent: I have reviewed the patients History and Physical, chart, labs and discussed the procedure including the risks, benefits and alternatives for the proposed anesthesia with the patient or authorized representative who has indicated his/her understanding and acceptance.     Dental advisory given  Plan Discussed with: CRNA and Surgeon  Anesthesia Plan Comments:         Anesthesia Quick Evaluation

## 2018-12-01 NOTE — Op Note (Signed)
[facility] Patient Name: Jean Kemp Procedure Date : 12/01/2018 MRN: [id number] Attending MD: Justice Britain , MD Date of Birth: [birth year] CSN: [id number] Age: 48 Admit Type: Inpatient Procedure:                Upper EUS Indications:              Duodenal deformity on endoscopy/Subepithelial tumor                            vs. extrinsic compression Providers:                Justice Britain, MD, Carlyn Reichert, RN, Ladona Ridgel, Technician, Vania Rea, CRNA Referring MD:             Gerrit Heck, MD Medicines:                Monitored Anesthesia Care Complications:            No immediate complications. Estimated Blood Loss:     Estimated blood loss was minimal. Procedure:                Pre-Anesthesia Assessment:                           - Prior to the procedure, a History and Physical                            was performed, and patient medications and                            allergies were reviewed. The patient's tolerance of                            previous anesthesia was also reviewed. The risks                            and benefits of the procedure and the sedation                            options and risks were discussed with the patient.                            All questions were answered, and informed consent                            was obtained. Prior Anticoagulants: The patient has                            taken no previous anticoagulant or antiplatelet                            agents. ASA Grade Assessment: II - A patient with  mild systemic disease. After reviewing the risks                            and benefits, the patient was deemed in                            satisfactory condition to undergo the procedure.                           After obtaining informed consent, the endoscope was                            passed under direct vision. Throughout  the                            procedure, the patient's blood pressure, pulse, and                            oxygen saturations were monitored continuously. The                            [id number] ([id number] Olympus therapeutic                            gastroscope was introduced through the mouth, and                            advanced to the second part of duodenum. After                            obtaining informed consent, the endoscope was                            passed under direct vision. Throughout the                            procedure, the patient's blood pressure, pulse, and                            oxygen saturations were monitored continuously.The                            upper GI endoscopy was accomplished without                            difficulty. The patient tolerated the procedure. Scope In: Scope Out: Findings:      ENDOSCOPIC FINDING: :      No gross mucosal lesions were noted in the proximal esophagus and in the       mid esophagus.      A non-bleeding diverticulum with a small opening and no stigmata of       recent bleeding was found in the distal esophagus.      No gross lesions were noted in the entire examined stomach.      A previously placed tattoo was  seen in the D1/D2 sweep (on my prior       EGD/EUS I had not previously documented the tattoo).      Adjacent to the tattoo, proximally towards the duodenal apex, a single 4       mm mucosal nodule was found in the first portion of the duodenum. After       EUS, preparations were made for mucosal resection. Saline was injected       to raise the lesion. Snare mucosal resection was performed. Resection       and retrieval were complete. To prevent bleeding after mucosal       resection, three hemostatic clips were successfully placed (MR       conditional). There was no bleeding at the end of the procedure.      No other gross lesions were noted in the second portion of the duodenum.       ENDOSONOGRAPHIC FINDING: :      Very minimal wall thickening was visualized endosonographically at the       apex of the duodenal bulb. This appeared to primarily be due to       thickening within the luminal interface/superficial mucosa (Layer 1) and       deep mucosa (Layer 2).      Endosonographic imaging in the visualized portion of the liver showed no       lesion.      No malignant-appearing lymph nodes were visualized in the celiac region       (level 20) and peripancreatic region.      The celiac was visualized. Impression:               EGD Impression:                           - No gross lesions in esophagus proximally. A                            diverticulum in the distal esophagus was noted just                            above GE Junction.                           - No gross mucosal lesions in the stomach.                           - A tattoo was seen in the duodenum.                           - Adjacent to the tattoo, proximally towards the                            duodenal apex, a mucosal nodule found in the                            duodenum. Removal was accomplished. Clips (MR                            conditional) were placed to decrease bleeding.                           -  No gross lesions in the second portion of the                            duodenum.                           EUS Impression:                           - Very minimal wall thickening was seen in the apex                            of the duodenal bulb. It appeared to primarily be                            within the luminal interface/superficial mucosa                            (Layer 1) and deep mucosa (Layer 2).                           - No malignant-appearing lymph nodes were                            visualized in the celiac region (level 20) and                            peripancreatic region. Recommendation:           - The patient will be observed post-procedure,                             until all discharge criteria are met.                           - Discharge patient to home.                           - Observe patient's clinical course.                           - Await path results.                           - Start PPI 40 mg twice daily x 2-weeks and then                            transition to PPI 40 mg daily x 6-weeks and then                            can come off.                           - Unless there is any evidence of a precancerous  lesion found - unlikely, patient will not need                            repeat EGD with EUS for surveillance.                           - Return to GI clinic as previously scheduled.                           - The findings and recommendations were discussed                            with the patient.                           - The findings and recommendations were discussed                            with the patient's family. Procedure Code(s):        --- Professional ---                           [id number], Esophagogastroduodenoscopy, flexible,                            transoral; with endoscopic mucosal resection                           [id number], Esophagogastroduodenoscopy, flexible,                            transoral; with endoscopic ultrasound examination                            limited to the esophagus, stomach or duodenum, and                            adjacent structures Diagnosis Code(s):        --- Professional ---                           Q39.6, Congenital diverticulum of esophagus                           K31.89, Other diseases of stomach and duodenum                           I89.9, Noninfective disorder of lymphatic vessels                            and lymph nodes, unspecified CPT copyright 2019 American Medical Association. All rights reserved. The codes documented in this report are preliminary and upon coder review may  be revised to meet current compliance  requirements. Justice Britain, MD 12/01/2018 11:05:10 AM Number of Addenda: 0

## 2018-12-01 NOTE — Anesthesia Postprocedure Evaluation (Signed)
Anesthesia Post Note  Patient: Etta Quill  Procedure(s) Performed: ESOPHAGOGASTRODUODENOSCOPY (EGD) WITH PROPOFOL (N/A ) UPPER ENDOSCOPIC ULTRASOUND (EUS) RADIAL (N/A ) ENDOSCOPIC MUCOSAL RESECTION (N/A ) SUBMUCOSAL LIFTING INJECTION HEMOSTASIS CLIP PLACEMENT     Patient location during evaluation: PACU Anesthesia Type: MAC Level of consciousness: awake and alert Pain management: pain level controlled Vital Signs Assessment: post-procedure vital signs reviewed and stable Respiratory status: spontaneous breathing, nonlabored ventilation, respiratory function stable and patient connected to nasal cannula oxygen Cardiovascular status: stable and blood pressure returned to baseline Postop Assessment: no apparent nausea or vomiting Anesthetic complications: no    Last Vitals:  Vitals:   12/01/18 1130 12/01/18 1145  BP: 122/70 121/68  Pulse: (!) 59 (!) 57  Resp: (!) 21 (!) 21  Temp:    SpO2: 100% 100%    Last Pain:  Vitals:   12/01/18 1145  TempSrc:   PainSc: 0-No pain                 Tiajuana Amass

## 2018-12-01 NOTE — Transfer of Care (Signed)
Immediate Anesthesia Transfer of Care Note  Patient: Jean Kemp  Procedure(s) Performed: ESOPHAGOGASTRODUODENOSCOPY (EGD) WITH PROPOFOL (N/A ) UPPER ENDOSCOPIC ULTRASOUND (EUS) RADIAL (N/A ) ENDOSCOPIC MUCOSAL RESECTION (N/A ) SUBMUCOSAL LIFTING INJECTION HEMOSTASIS CLIP PLACEMENT  Patient Location: Endoscopy Unit  Anesthesia Type:MAC  Level of Consciousness: drowsy  Airway & Oxygen Therapy: Patient Spontanous Breathing and Patient connected to nasal cannula oxygen  Post-op Assessment: Report given to RN and Post -op Vital signs reviewed and stable  Post vital signs: Reviewed and stable  Last Vitals:  Vitals Value Taken Time  BP    Temp    Pulse    Resp    SpO2      Last Pain:  Vitals:   12/01/18 0903  TempSrc: Temporal  PainSc: 0-No pain         Complications: No apparent anesthesia complications

## 2018-12-02 ENCOUNTER — Encounter (HOSPITAL_COMMUNITY): Payer: Self-pay | Admitting: Gastroenterology

## 2018-12-03 LAB — SURGICAL PATHOLOGY

## 2018-12-05 ENCOUNTER — Encounter: Payer: Self-pay | Admitting: Gastroenterology

## 2018-12-20 ENCOUNTER — Ambulatory Visit (INDEPENDENT_AMBULATORY_CARE_PROVIDER_SITE_OTHER): Payer: 59 | Admitting: Gastroenterology

## 2018-12-20 ENCOUNTER — Other Ambulatory Visit: Payer: Self-pay

## 2018-12-20 ENCOUNTER — Encounter: Payer: Self-pay | Admitting: Gastroenterology

## 2018-12-20 VITALS — BP 110/70 | HR 101 | Temp 97.9°F | Ht 64.0 in | Wt 144.2 lb

## 2018-12-20 DIAGNOSIS — R140 Abdominal distension (gaseous): Secondary | ICD-10-CM | POA: Diagnosis not present

## 2018-12-20 DIAGNOSIS — Z8719 Personal history of other diseases of the digestive system: Secondary | ICD-10-CM

## 2018-12-20 DIAGNOSIS — Z9889 Other specified postprocedural states: Secondary | ICD-10-CM | POA: Diagnosis not present

## 2018-12-20 DIAGNOSIS — K219 Gastro-esophageal reflux disease without esophagitis: Secondary | ICD-10-CM

## 2018-12-20 DIAGNOSIS — K3189 Other diseases of stomach and duodenum: Secondary | ICD-10-CM | POA: Diagnosis not present

## 2018-12-20 DIAGNOSIS — Z86010 Personal history of colonic polyps: Secondary | ICD-10-CM

## 2018-12-20 DIAGNOSIS — R109 Unspecified abdominal pain: Secondary | ICD-10-CM

## 2018-12-20 MED ORDER — HYOSCYAMINE SULFATE 0.125 MG SL SUBL: SUBLINGUAL_TABLET | SUBLINGUAL | 5 refills | Status: AC

## 2018-12-20 NOTE — Progress Notes (Signed)
P  Chief Complaint: Duodenal nodule, domino bloating, GERD     GI History: 48 year old female initially seen in 02/2018 for MEG pain and abdominal bloating along with longstanding history of reflux with Nissen fundoplication/lap hernia repair in [place] in approximately 2013.  Index reflux symptoms of dyspepsia, heartburn, regurgitation, with resolution with fundoplication.  Did develop bloating and gas distention since surgery.  MEG/LUQ pain reported as the same as prior to fundoplication dyspepsia symptoms, which had resolved with fundoplication, but recurred in 2020.  Repeat EGD in 03/24/2018 notable for loose wrap with recommendation for TIF.  EGD also with 5 mm submucosal nodule in D1.  Endoscopic Hx: -EGD/EUS (11/2018, Dr. Rush Landmark): Distal esophageal diverticulum.  Tattoo noted in the D1/D2 sweep (not seen on any prior endoscopies).  4 mm mucosal nodule, resected by EMR with 3 hemostatic clips placed) path: Nodular peptic duodenitis without malignancy or dysplasia). -EUS (03/2018, Dr. Rush Landmark): 4.2 x 3.9 mm intramural lesion originating from layer 2 and later 3, benign-appearing with appearance suggestive of lipoma.  Tunneled biopsies: Peptic duodenitis.  Recommended repeat EUS in 6 months -Colonoscopy (02/2018, Dr. Bryan Lemma): 5 mm adenoma, internal hemorrhoids.  Repeat in 7 years -EGD (02/2018, Dr. Bryan Lemma): Evidence of Nissen fundoplication with loose appearing wrap, particularly on the anterior side.  Normal stomach, biopsies negative for H. pylori.  5 mm submucosal nodule in the first portion of the duodenum with normal overlying mucosa. - EGD (unsure when; [place]): Hiatal hernia, gastritis and possible erosive esophagitis per patient  HPI:    Patient is a 48 y.o. female presenting to the [facility] for follow-up.  Last seen in 05/2018, and at that time still with intermittent bloating/gas pain, typically postprandial.  No certain foods identified.  No improvement  with Gas-X (improved bloating but not pain).  Prilosec helps with MEG pain/dyspepsia, but does not want to keep taking due to ADR profile.  Reflux symptoms (heartburn/regurgitation) controlled when taking PPI.  Was treated with rifaximin and align for suspected SIBO with significant improvement in bloating and resolution of abdominal cramping. Will still use Levsin a few times/week for abdominal bloating with good response.  No reflux symptoms.  Since then, underwent EGD/EUS with EMR of mucosal duodenal nodule earlier this month.  Path with benign peptic duodenitis without malignancy or dysplasia.  Distal esophageal diverticulum noted on that study.  Was started on Prilosec 40 mg bid to promote mucosal healing and decrease post polypectomy bleed, with plan to titrate down.  She otherwise feels well, and is without any complaints today.   Review of systems:     No chest pain, no SOB, no fevers, no urinary sx   Past Medical History:  Diagnosis Date  . Asthma   . Complication of anesthesia    woke up during Endo procedure per pt/ per MD  . GERD (gastroesophageal reflux disease)   . Headache    sinus  . Heart murmur    mvp-never had echo  . History of hiatal hernia    had Nissen Fundoplication  . Hx of hiatal hernia 06/29/2017  . Hypertension   . Mixed hyperlipidemia 02/18/2017  . Multiple allergies   . Vitamin D deficiency 02/18/2017    Patient's surgical history, family medical history, social history, medications and allergies were all reviewed in Epic    Current Outpatient Medications  Medication Sig Dispense Refill  . budesonide (RHINOCORT ALLERGY) 32 MCG/ACT nasal spray Place 1 spray into both nostrils daily as needed for allergies.     Marland Kitchen  estradiol (VIVELLE-DOT) 0.05 MG/24HR patch Place 1 patch onto the skin 2 (two) times a week.     . hyoscyamine (LEVSIN SL) 0.125 MG SL tablet PLACE 1 TABLET (0.125 MG TOTAL) UNDER THE TONGUE EVERY 6 (SIX) HOURS AS NEEDED. (Patient taking differently:  Place 0.125 mg under the tongue every 6 (six) hours as needed for cramping. ) 360 tablet 0  . levocetirizine (XYZAL) 5 MG tablet Take 5 mg by mouth daily as needed for allergies.     Marland Kitchen omeprazole (PRILOSEC) 40 MG capsule Take 1 capsule (40 mg total) by mouth 2 (two) times daily before a meal for 14 days. 28 capsule 0  . spironolactone (ALDACTONE) 100 MG tablet TAKE 1 TABLET BY MOUTH EVERY DAY 90 tablet 3  . sucralfate (CARAFATE) 1 g tablet Take 1 tablet (1 g total) by mouth 4 (four) times daily. (Patient taking differently: Take 1 g by mouth 4 (four) times daily as needed (stomach issues). ) 120 tablet 0  . tretinoin (RETIN-A) 0.1 % cream Apply topically 2 (two) times daily. (Patient taking differently: Apply 1 application topically daily as needed (skin irritation). ) 45 g 12  . valACYclovir (VALTREX) 500 MG tablet Take 500 mg by mouth daily.    Merril Abbe 10 MCG TABS vaginal tablet Place 1 tablet vaginally 2 (two) times a week.     Marland Kitchen omeprazole (PRILOSEC) 40 MG capsule Take 1 capsule (40 mg total) by mouth daily. This will be started after completion of 2-weeks of Omeprazole 40 BID (separate prescription to be sent to pharmacy). (Patient not taking: Reported on 12/20/2018) 42 capsule 0   No current facility-administered medications for this visit.     Physical Exam:     BP 110/70   Pulse (!) 101   Temp 97.9 F (36.6 C)   Ht 5\' 4"  (1.626 m)   Wt 144 lb 4 oz (65.4 kg)   BMI 24.76 kg/m   GENERAL:  Pleasant female in NAD PSYCH: : Cooperative, normal affect EENT:  conjunctiva pink, mucous membranes moist, neck supple without masses CARDIAC:  RRR, no murmur heard, no peripheral edema PULM: Normal respiratory effort, lungs CTA bilaterally, no wheezing ABDOMEN:  Nondistended, soft, nontender. No obvious masses, no hepatomegaly,  normal bowel sounds SKIN:  turgor, no lesions seen Musculoskeletal:  Normal muscle tone, normal strength NEURO: Alert and oriented x 3, no focal neurologic deficits    IMPRESSION and PLAN:    1) Bloating: 2) Abdominal cramping: -Significant improvement in cramping and resolution of bloating with trial of Rifaximin/Align -Resume Levsin as needed.  Refill placed today  3) History of Nissen Fundoplication: 4) GERD 5) History of hiatal hernia 6) Esophageal diverticulum  History of hiatal hernia and reflux, corrected by lap hernia repair and Nissen fundoplication in [place] approximately 2013.  EGD earlier this year with loose wrap.  Had a lengthy discussion again today regarding repeat antireflux surgery, to include TIF vs repeat Nissen.  Given the significant clinical improvement as outlined above, she would like to hold off on any antireflux surgery at this time.  Of note, recent EGD with distal esophageal diverticulum, which precludes TIF as an option. -Resume Prilosec as currently prescribed, with plan to wean to lowest effective dose, or discontinue completely as tolerated -Resume antireflux lifestyle/dietary modifications -Obtaining records from [place].  She signed consents for those today  7) Duodenal nodule: - s/p EUS with EMR-peptic duodenitis without malignancy or dysplasia.  No specific follow-up needed for this  8) Colon polyp: -Repeat  colonoscopy in 2027 for ongoing polyp surveillance  RTC in 3 to 6 months or sooner as needed  I spent a total of 25 minutes of face-to-face time with the patient. Greater than 50% of the time was spent counseling and coordinating care.      [place] ,DO, FACG 12/20/2018, 1:25 PM

## 2018-12-20 NOTE — Patient Instructions (Signed)
We have sent the following medications to your pharmacy for you to pick up at your convenience:  Return to office in 3-6 months  It was a pleasure to see you today!  Vito Cirigliano, D.O.   

## 2018-12-26 ENCOUNTER — Other Ambulatory Visit: Payer: Self-pay | Admitting: Gastroenterology

## 2019-03-09 ENCOUNTER — Telehealth: Payer: Self-pay | Admitting: Gastroenterology

## 2019-03-09 NOTE — Telephone Encounter (Signed)
Spoke to patient to let her know that we have not received records From [place]. She does not remember the name of the office that she needs the records from. Patient stated she will call back once she figures out the name. I will contact the office at that time.

## 2019-03-09 NOTE — Telephone Encounter (Signed)
Pt wants to know if we received her records from [place]. She stated that she signed a release form on her last ov. Pls call her to confirm.

## 2019-03-25 ENCOUNTER — Other Ambulatory Visit: Payer: Self-pay | Admitting: Gastroenterology

## 2019-05-10 ENCOUNTER — Ambulatory Visit (INDEPENDENT_AMBULATORY_CARE_PROVIDER_SITE_OTHER): Payer: 59

## 2019-05-10 ENCOUNTER — Ambulatory Visit (INDEPENDENT_AMBULATORY_CARE_PROVIDER_SITE_OTHER): Payer: 59 | Admitting: Family Medicine

## 2019-05-10 ENCOUNTER — Encounter: Payer: Self-pay | Admitting: Family Medicine

## 2019-05-10 ENCOUNTER — Other Ambulatory Visit: Payer: Self-pay

## 2019-05-10 ENCOUNTER — Other Ambulatory Visit: Payer: Self-pay | Admitting: Family Medicine

## 2019-05-10 VITALS — BP 110/80 | HR 94 | Ht 64.0 in | Wt 142.0 lb

## 2019-05-10 DIAGNOSIS — M79645 Pain in left finger(s): Secondary | ICD-10-CM

## 2019-05-10 NOTE — Progress Notes (Signed)
Rito Ehrlich, am serving as a Education administrator for Dr. Lynne Leader.  Jean Kemp is a 49 y.o. female who presents to [place] at [facility] today for L thumb pain.  She was last seen by Dr. Georgina Snell on 09/22/18 for her hamstring.  Today, she notes L thumb pain x1 1/2 months fell while skiing did have an x-ray that showed no fracture.  She rates her pain as anywhere from 0-7/10 and describes her pain as sharp shooting when moving wrong way and intermittent aching pain. Pain is located at the thumb interphalangeal joint ulnar side. She was treated with immobilization for 3 weeks which have helped some. She is right-hand dominant.  Radiating pain: stays in the thumb mostly in knuckle  Swelling:yes Numbness/tingling: no Aggravating factors: moving the wrong way or touching wrong way Treatments tried: ice tylenol, splint   Pertinent review of systems: No fevers or chills  Relevant historical information: History hamstring tendinitis and hyperlipidemia   Exam:  BP 110/80 (BP Location: Left Arm, Patient Position: Sitting, Cuff Size: Normal)   Pulse 94   Ht 5\' 4"  (1.626 m)   Wt 142 lb (64.4 kg)   SpO2 98%   BMI 24.37 kg/m  General: Well Developed, well nourished, and in no acute distress.   MSK: Left thumb slightly swollen at ulnar aspect of IP joint otherwise normal-appearing Normal motion to flexion extension. Strength intact to flexion extension. No significant laxity to ulnar stress compared to contralateral right thumb. Capillary refill and sensation are intact distally.    Lab and Radiology Results Diagnostic Limited MSK Ultrasound of: Left thumb interphalangeal joint IP joint visualized. Ulnar collateral ligament partially visible. Some gapping with ulnar collateral stress without obvious gap between ligament ends seen on ultrasound. However accuracy limited by resolution for structure of the small. No bony changes. Small joint effusion/synovitis  present. Impression: Traumatic synovitis left thumb IP joint. Possible healing ulnar collateral ligament tear IP joint     Assessment and Plan: 50 y.o. female with left thumb injury occurring about 5 weeks ago. Patient effectively had a skiers thumb of the IP joint. It is probable that she did tear or stretch or partially tear the IP joint ulnar collateral ligament of her left thumb. At this point she does not have severe laxity. I cannot see obvious torn ligament on ultrasound examination today however the resolution of ultrasound for a small structure like this is limited. Plan for referral to hand therapy/occupational therapy. Recheck back in 6 weeks if not improved and worse enough to consider surgery would likely proceed with either referral to hand surgery or MRI arthrogram.   PDMP not reviewed this encounter. Orders Placed This Encounter  Procedures  . [place] LIMITED JOINT SPACE STRUCTURES UP LEFT    Standing Status:   Future    Number of Occurrences:   1    Standing Expiration Date:   07/09/2020    Order Specific Question:   Reason for Exam (SYMPTOM  OR DIAGNOSIS REQUIRED)    Answer:   Left thumb pain    Order Specific Question:   Preferred imaging location?    Answer:   [facility]  . Ambulatory referral to Physical Therapy    Referral Priority:   Routine    Referral Type:   Physical Medicine    Referral Reason:   Specialty Services Required    Requested Specialty:   Physical Therapy    Number of Visits Requested:   1  No orders of the defined types were placed in this encounter.    Discussed warning signs or symptoms. Please see discharge instructions. Patient expresses understanding.   The above documentation has been reviewed and is accurate and complete Lynne Leader

## 2019-05-10 NOTE — Patient Instructions (Signed)
Thank you for coming in today. Lets try hand PT/Occupational therapy.  You have a jammed thumb.  The name is Traumatic Synovitis You also may have torn the Ulnar Colateral Ligament in the thumb.  This is called skiiers thumb or gamekeeper thumb when it is in the bigger joint in the thumb.  You did a great job of holding it still.  Plan for therapy.  Ok to voltaren gel over the counter.   Recheck in 6 weeks.  Let me know sooner if not doing well.    Skier's Thumb  Skier's thumb is a stretched or torn ligament in the thumb from a sudden injury (acute injury). It is sometimes called gamekeeper's thumb if it developed gradually (chronic injury) from repeated overstretching of the ligaments. Ligaments are strong bands of tissue that connect bones. The ligament that is injured (ulnar collateral ligament) connects the bones that make up the joint at the base of the thumb. A tear can be either partial or complete. The severity of the injury depends on how much of the ligament was damaged or torn. If it is not treated properly, this injury can lead to arthritis. What are the causes? This condition occurs when the thumb is forcefully moved past its normal range of motion toward the wrist. It may be caused by:  Falling onto an outstretched hand. This often happens to skiers who fall with ski poles in their hands.  Repeated movements that use the thumb, like catching a ball or other object. What increases the risk? You are more likely to develop this condition if:  You had a previous thumb injury.  You play contact sports or sports that involve catching balls, such as baseball, basketball, or football.  You do activities that increase the chance that the thumb will be pulled away from the rest of the hand.  You have poor hand strength and flexibility.  You do not warm up properly before activities. What are the signs or symptoms? Symptoms of this condition include:  Pain or tenderness.   Swelling.  Trouble grasping or pinching with the injured thumb.  Bruising or redness. If the injury is severe, a lump (mass) may be felt under the skin in the injured area. How is this diagnosed? This condition may be diagnosed based on:  Your symptoms and medical history.  A physical exam.  Imaging tests, such as X-rays, an ultrasound, or an MRI. How is this treated? Treatment for this condition depends on the severity of your injury.  If the ligament is overstretched or partially torn, treatment usually involves keeping your thumb in a fixed position (immobilization) for a period of time. Your health care provider will apply a brace, cast, or splint to keep your thumb from moving until it heals.  If the ligament is fully torn, you may need surgery to reconnect the ligament to the bone. After surgery, you will need to wear a cast or splint on your thumb. Your health care provider may also suggest exercises or physical therapy to strengthen your thumb. Follow these instructions at home: If you have a cast:  Do not stick anything inside the cast to scratch your skin. Doing that increases your risk of infection.  Check the skin around the cast every day. Tell your health care provider about any concerns.  You may put lotion on dry skin around the edges of the cast. Do not put lotion on the skin underneath the cast.  Keep the cast clean and dry. If  you have a splint or brace:   Wear the splint or brace as told by your health care provider. Remove it only as told by your health care provider.  Loosen the splint or brace if your fingers tingle, become numb, or turn cold and blue.  Keep the splint or brace clean and dry. Bathing  Do not take baths, swim, or use a hot tub until your health care provider approves. Ask your health care provider if you may take showers. You may only be allowed to take sponge baths.  If your cast, splint, or brace is not waterproof: ? Do not let it  get wet. ? Cover it with a watertight covering when you take a bath or shower. Managing pain, stiffness, and swelling   If directed, put ice on the injured area: ? If you have a removable splint or brace, remove it as told by your health care provider. ? Put ice in a plastic bag. ? Place a towel between your skin and the bag or between your cast and the bag. ? Leave the ice on for 20 minutes, 2-3 times a day.  Move your fingers often to avoid stiffness and to lessen swelling.  Raise (elevate) the injured area above the level of your heart while you are sitting or lying down. Driving  Do not drive or use heavy machinery while taking prescription pain medicine.  Ask your health care provider when it is safe to drive if you have a cast, splint, or brace on your hand. Activity  Return to your normal activities as told by your health care provider. Ask your health care provider what activities are safe for you.  Do exercises as told by your health care provider or physical therapist. General instructions  Do not put pressure on any part of the cast or splint until it is fully hardened. This may take several hours.  Do not wear rings on your injured thumb.  Take over-the-counter and prescription medicines only as told by your health care provider.  To prevent or treat constipation while you are taking prescription pain medicine, your health care provider may recommend that you: ? Drink enough fluid to keep your urine pale yellow. ? Eat foods that are high in fiber, such as beans, whole grains, and fresh fruits and vegetables. ? Limit foods that are high in fat and processed sugars, such as fried or sweet foods. ? Take an over-the-counter or prescription medicine for constipation.  Keep all follow-up visits as told by your health care provider. This is important. Contact a health care provider if:  Your pain is not controlled with medicine.  Your bruising or swelling gets worse.   Your cast or splint is damaged.  Your thumb is numb and feels colder to the touch than normal. Get help right away if:  You have severe pain.  Your thumb is pale or blue. Summary  Skier's thumb is a stretched or torn ligament in the thumb.  This injury can happen suddenly (acute) or may develop gradually (chronic).  Treatment usually involves wearing a cast, splint, or brace on your thumb. Surgery may be needed if the ligament is fully torn. This information is not intended to replace advice given to you by your health care provider. Make sure you discuss any questions you have with your health care provider. Document Revised: 05/06/2017 Document Reviewed: 05/06/2017 Elsevier Patient Education  [place].

## 2019-05-17 NOTE — Telephone Encounter (Signed)
Est with Dr. Zigmund Daniel on 05/23/19.

## 2019-05-17 NOTE — Telephone Encounter (Signed)
Pt needs to establish with new PCP

## 2019-05-23 ENCOUNTER — Other Ambulatory Visit: Payer: Self-pay

## 2019-05-23 ENCOUNTER — Ambulatory Visit (INDEPENDENT_AMBULATORY_CARE_PROVIDER_SITE_OTHER): Payer: 59 | Admitting: Family Medicine

## 2019-05-23 ENCOUNTER — Encounter: Payer: Self-pay | Admitting: Family Medicine

## 2019-05-23 VITALS — BP 126/87 | HR 81 | Temp 98.2°F | Ht 64.5 in | Wt 145.0 lb

## 2019-05-23 DIAGNOSIS — K219 Gastro-esophageal reflux disease without esophagitis: Secondary | ICD-10-CM | POA: Diagnosis not present

## 2019-05-23 DIAGNOSIS — E782 Mixed hyperlipidemia: Secondary | ICD-10-CM | POA: Diagnosis not present

## 2019-05-23 DIAGNOSIS — L700 Acne vulgaris: Secondary | ICD-10-CM

## 2019-05-23 DIAGNOSIS — L709 Acne, unspecified: Secondary | ICD-10-CM | POA: Insufficient documentation

## 2019-05-23 LAB — CBC
HCT: 40.8 % (ref 35.0–45.0)
Hemoglobin: 13.9 g/dL (ref 11.7–15.5)
MCH: 32.6 pg (ref 27.0–33.0)
MCHC: 34.1 g/dL (ref 32.0–36.0)
MCV: 95.8 fL (ref 80.0–100.0)
MPV: 9.6 fL (ref 7.5–12.5)
Platelets: 351 10*3/uL (ref 140–400)
RBC: 4.26 10*6/uL (ref 3.80–5.10)
RDW: 12.8 % (ref 11.0–15.0)
WBC: 4.7 10*3/uL (ref 3.8–10.8)

## 2019-05-23 LAB — LIPID PANEL
Cholesterol: 264 mg/dL — ABNORMAL HIGH (ref <200)
HDL: 81 mg/dL (ref >=50)
LDL Cholesterol (Calc): 162 mg/dL (calc) — ABNORMAL HIGH
Non-HDL Cholesterol (Calc): 183 mg/dL (calc) — ABNORMAL HIGH (ref <130)
Total CHOL/HDL Ratio: 3.3 (calc) (ref <5.0)
Triglycerides: 99 mg/dL (ref <150)

## 2019-05-23 LAB — COMPLETE METABOLIC PANEL WITH GFR
AG Ratio: 1.8 (calc) (ref 1.0–2.5)
ALT: 13 U/L (ref 6–29)
AST: 14 U/L (ref 10–35)
Albumin: 4.5 g/dL (ref 3.6–5.1)
Alkaline phosphatase (APISO): 96 U/L (ref 37–153)
BUN: 18 mg/dL (ref 7–25)
CO2: 27 mmol/L (ref 20–32)
Calcium: 9.5 mg/dL (ref 8.6–10.4)
Chloride: 102 mmol/L (ref 98–110)
Creat: 0.9 mg/dL (ref 0.50–1.05)
GFR, Est African American: 86 mL/min/{1.73_m2} (ref >=60)
GFR, Est Non African American: 74 mL/min/{1.73_m2} (ref >=60)
Globulin: 2.5 g/dL (calc) (ref 1.9–3.7)
Glucose, Bld: 102 mg/dL — ABNORMAL HIGH (ref 65–99)
Potassium: 4.3 mmol/L (ref 3.5–5.3)
Sodium: 136 mmol/L (ref 135–146)
Total Bilirubin: 0.4 mg/dL (ref 0.2–1.2)
Total Protein: 7 g/dL (ref 6.1–8.1)

## 2019-05-23 MED ORDER — TRETINOIN 0.1 % EX CREA: TOPICAL_CREAM | Freq: Two times a day (BID) | CUTANEOUS | 12 refills | Status: DC

## 2019-05-23 MED ORDER — SPIRONOLACTONE 100 MG PO TABS: 100.0000 mg | ORAL_TABLET | Freq: Every day | ORAL | 1 refills | Status: DC

## 2019-05-23 NOTE — Assessment & Plan Note (Signed)
Doing well with tretinoin and spironolactone.  Continue at current dose.  Update labs today to check potassium levels.

## 2019-05-23 NOTE — Patient Instructions (Signed)
Great to meet you today! Please continue current medications.  Have labs completed.  We'll be in touch with results.  Please call or send Mychart message with any questions.

## 2019-05-23 NOTE — Assessment & Plan Note (Signed)
Symptoms are well managed with Omeprazole, continue at current dose.

## 2019-05-23 NOTE — Progress Notes (Signed)
Jean Kemp - 49 y.o. female MRN [id number]  Date of birth: [birth year]  Subjective Chief Complaint  Patient presents with  . Follow-up    HPI Jean Kemp is a 49 y.o. female with history of HLD, GERD, and Acne here today for follow up.   -Acne:  Managed with combination of aldactone and retin-a cream, needs refills of both of these.  She reports that this continues to work well for her.    -GERD/GI cramping:  Followed by GI.  Currently managed with levsin and omeprazole.  Stable at this time.   ROS:  A comprehensive ROS was completed and negative except as noted per HPI  Allergies  Allergen Reactions  . Aspirin Shortness Of Breath and Other (See Comments)    wheeze wheeze Overall sinus related problems patient reported  . Ibuprofen Shortness Of Breath    Wheezing  Wheezing  . Sulfa Antibiotics Hives  . Sulfasalazine Hives    Past Medical History:  Diagnosis Date  . Asthma   . Complication of anesthesia    woke up during Endo procedure per pt/ per MD  . GERD (gastroesophageal reflux disease)   . Headache    sinus  . Heart murmur    mvp-never had echo  . History of hiatal hernia    had Nissen Fundoplication  . Hx of hiatal hernia 06/29/2017  . Hypertension   . Mixed hyperlipidemia 02/18/2017  . Multiple allergies   . Vitamin D deficiency 02/18/2017    Past Surgical History:  Procedure Laterality Date  . ABDOMINAL HYSTERECTOMY    . BIOPSY  04/12/2018   Procedure: BIOPSY;  Surgeon: Rush Landmark Telford Nab., MD;  Location: [facility];  Service: Gastroenterology;;  . COLONOSCOPY    . ENDOSCOPIC MUCOSAL RESECTION N/A 12/01/2018   Procedure: ENDOSCOPIC MUCOSAL RESECTION;  Surgeon: Rush Landmark Telford Nab., MD;  Location: [facility];  Service: Gastroenterology;  Laterality: N/A;  . ESOPHAGOGASTRODUODENOSCOPY (EGD) WITH PROPOFOL N/A 04/12/2018   Procedure: ESOPHAGOGASTRODUODENOSCOPY (EGD) WITH PROPOFOL;  Surgeon: Rush Landmark Telford Nab.,  MD;  Location: [facility];  Service: Gastroenterology;  Laterality: N/A;  . ESOPHAGOGASTRODUODENOSCOPY (EGD) WITH PROPOFOL N/A 12/01/2018   Procedure: ESOPHAGOGASTRODUODENOSCOPY (EGD) WITH PROPOFOL;  Surgeon: Rush Landmark Telford Nab., MD;  Location: [facility];  Service: Gastroenterology;  Laterality: N/A;  . EUS N/A 04/12/2018   Procedure: UPPER ENDOSCOPIC ULTRASOUND (EUS) RADIAL;  Surgeon: Rush Landmark Telford Nab., MD;  Location: [facility];  Service: Gastroenterology;  Laterality: N/A;  . EUS N/A 12/01/2018   Procedure: UPPER ENDOSCOPIC ULTRASOUND (EUS) RADIAL;  Surgeon: Rush Landmark Telford Nab., MD;  Location: [facility];  Service: Gastroenterology;  Laterality: N/A;  . HEMOSTASIS CLIP PLACEMENT  12/01/2018   Procedure: HEMOSTASIS CLIP PLACEMENT;  Surgeon: Irving Copas., MD;  Location: [facility];  Service: Gastroenterology;;  . HERNIA REPAIR     abdominal hernia   . HIATAL HERNIA REPAIR    . NISSEN FUNDOPLICATION    . SEPTOPLASTY N/A 05/16/2014   Procedure: SEPTOPLASTY;  Surgeon: Leta Baptist, MD;  Location: [facility];  Service: ENT;  Laterality: N/A;  . SUBMUCOSAL LIFTING INJECTION  04/12/2018   Procedure: SUBMUCOSAL LIFTING INJECTION;  Surgeon: Irving Copas., MD;  Location: [facility];  Service: Gastroenterology;;  . Lia Foyer LIFTING INJECTION  12/01/2018   Procedure: SUBMUCOSAL LIFTING INJECTION;  Surgeon: Irving Copas., MD;  Location: [facility];  Service: Gastroenterology;;  . Lia Foyer TATTOO INJECTION  04/12/2018   Procedure: SUBMUCOSAL TATTOO INJECTION;  Surgeon: Irving Copas., MD;  Location: [facility];  Service: Gastroenterology;;  . TONSILLECTOMY    . TURBINATE REDUCTION Bilateral 05/16/2014   Procedure: BILATERAL TURBINATE REDUCTION;  Surgeon: Leta Baptist, MD;  Location: [facility];  Service: ENT;  Laterality: Bilateral;  . UPPER GI ENDOSCOPY     In [place] in 2012-2013    Social History    Socioeconomic History  . Marital status: Married    Spouse name: Not on file  . Number of children: 2  . Years of education: Not on file  . Highest education level: Not on file  Occupational History  . Occupation: attorney  Tobacco Use  . Smoking status: Never Smoker  . Smokeless tobacco: Never Used  Substance and Sexual Activity  . Alcohol use: Yes    Comment: occ  . Drug use: No  . Sexual activity: Not on file  Other Topics Concern  . Not on file  Social History Narrative  . Not on file   Social Determinants of Health   Financial Resource Strain:   . Difficulty of Paying Living Expenses:   Food Insecurity:   . Worried About Charity fundraiser in the Last Year:   . Arboriculturist in the Last Year:   Transportation Needs:   . Film/video editor (Medical):   Marland Kitchen Lack of Transportation (Non-Medical):   Physical Activity:   . Days of Exercise per Week:   . Minutes of Exercise per Session:   Stress:   . Feeling of Stress :   Social Connections:   . Frequency of Communication with Friends and Family:   . Frequency of Social Gatherings with Friends and Family:   . Attends Religious Services:   . Active Member of Clubs or Organizations:   . Attends Archivist Meetings:   Marland Kitchen Marital Status:     Family History  Problem Relation Age of Onset  . Colon cancer Neg Hx   . Esophageal cancer Neg Hx   . Breast cancer Neg Hx   . Stomach cancer Neg Hx     Health Maintenance  Topic Date Due  . MAMMOGRAM  01/15/2019  . INFLUENZA VACCINE  08/28/2019  . COLONOSCOPY  03/24/2025  . TETANUS/TDAP  06/18/2025  . HIV Screening  Completed     ----------------------------------------------------------------------------------------------------------------------------------------------------------------------------------------------------------------- Physical Exam BP 126/87   Pulse 81   Temp 98.2 F (36.8 C) (Oral)   Ht 5' 4.5" (1.638 m)   Wt 145 lb (65.8 kg)    BMI 24.50 kg/m   Physical Exam Constitutional:      Appearance: Normal appearance.  HENT:     Head: Normocephalic and atraumatic.  Eyes:     General: Scleral icterus present.  Cardiovascular:     Rate and Rhythm: Normal rate and regular rhythm.  Pulmonary:     Effort: Pulmonary effort is normal.     Breath sounds: Normal breath sounds.  Musculoskeletal:     Cervical back: Neck supple.  Neurological:     General: No focal deficit present.     Mental Status: She is alert.  Psychiatric:        Mood and Affect: Mood normal.     ------------------------------------------------------------------------------------------------------------------------------------------------------------------------------------------------------------------- Assessment and Plan  Acne Doing well with tretinoin and spironolactone.  Continue at current dose.  Update labs today to check potassium levels.   Mixed hyperlipidemia Update lipid panel.   GERD (gastroesophageal reflux disease) Symptoms are well managed with Omeprazole, continue at current dose.     Meds ordered this encounter  Medications  .  spironolactone (ALDACTONE) 100 MG tablet    Sig: Take 1 tablet (100 mg total) by mouth daily.    Dispense:  90 tablet    Refill:  1  . tretinoin (RETIN-A) 0.1 % cream    Sig: Apply topically 2 (two) times daily.    Dispense:  45 g    Refill:  12    Return in about 6 months (around 11/22/2019) for Medication follow up. .    This visit occurred during the SARS-CoV-2 public health emergency.  Safety protocols were in place, including screening questions prior to the visit, additional usage of staff PPE, and extensive cleaning of exam room while observing appropriate contact time as indicated for disinfecting solutions.

## 2019-05-23 NOTE — Assessment & Plan Note (Signed)
Update lipid panel.  

## 2019-06-07 ENCOUNTER — Telehealth (INDEPENDENT_AMBULATORY_CARE_PROVIDER_SITE_OTHER): Payer: 59 | Admitting: Family Medicine

## 2019-06-07 ENCOUNTER — Telehealth: Payer: Self-pay | Admitting: Family Medicine

## 2019-06-07 ENCOUNTER — Encounter: Payer: Self-pay | Admitting: Family Medicine

## 2019-06-07 DIAGNOSIS — B001 Herpesviral vesicular dermatitis: Secondary | ICD-10-CM | POA: Insufficient documentation

## 2019-06-07 MED ORDER — ACYCLOVIR 5 % EX OINT: 1.0000 "application " | TOPICAL_OINTMENT | CUTANEOUS | 1 refills | Status: AC

## 2019-06-07 MED ORDER — VALACYCLOVIR HCL 1 G PO TABS: 1000.0000 mg | ORAL_TABLET | Freq: Every day | ORAL | 2 refills | Status: DC

## 2019-06-07 NOTE — Telephone Encounter (Signed)
Received fax for PA on Acyclovier sent through cover my meds waiting on determination - CF

## 2019-06-07 NOTE — Progress Notes (Signed)
Jean Kemp - 49 y.o. female MRN [id number]  Date of birth: [birth year]   This visit type was conducted due to national recommendations for restrictions regarding the COVID-19 Pandemic (e.g. social distancing).  This format is felt to be most appropriate for this patient at this time.  All issues noted in this document were discussed and addressed.  No physical exam was performed (except for noted visual exam findings with Video Visits).  I discussed the limitations of evaluation and management by telemedicine and the availability of in person appointments. The patient expressed understanding and agreed to proceed.  I connected with@ on 06/07/19 at 11:30 AM EDT by a video enabled telemedicine application and verified that I am speaking with the correct person using two identifiers.  Present at visit: Luetta Nutting, DO Etta Quill   Patient Location: Home [address] [place] [address]   Provider location:   [facility] [place]  No chief complaint on file.   HPI  Jean Kemp is a 49 y.o. female who presents via audio/video conferencing for a telehealth visit today.  She is being seen today for complaint of recurrent cold sores.  She reports that she has had increased frequency of cold sore outbreaks.  Prior to each outbreak she has prodrome of flu like feeling.  She is taking valtrex 500mg  daily but doesn't feel like this is working as well as when she initially started taking.  She denies any other changes in health or medications recently.    ROS:  A comprehensive ROS was completed and negative except as noted per HPI  Past Medical History:  Diagnosis Date  . Asthma   . Complication of anesthesia    woke up during Endo procedure per pt/ per MD  . GERD (gastroesophageal reflux disease)   . Headache    sinus  . Heart murmur    mvp-never had echo  . History of hiatal hernia    had Nissen Fundoplication  . Hx of hiatal hernia 06/29/2017   . Hypertension   . Mixed hyperlipidemia 02/18/2017  . Multiple allergies   . Vitamin D deficiency 02/18/2017    Past Surgical History:  Procedure Laterality Date  . ABDOMINAL HYSTERECTOMY    . BIOPSY  04/12/2018   Procedure: BIOPSY;  Surgeon: Rush Landmark Telford Nab., MD;  Location: [facility];  Service: Gastroenterology;;  . COLONOSCOPY    . ENDOSCOPIC MUCOSAL RESECTION N/A 12/01/2018   Procedure: ENDOSCOPIC MUCOSAL RESECTION;  Surgeon: Rush Landmark Telford Nab., MD;  Location: [facility];  Service: Gastroenterology;  Laterality: N/A;  . ESOPHAGOGASTRODUODENOSCOPY (EGD) WITH PROPOFOL N/A 04/12/2018   Procedure: ESOPHAGOGASTRODUODENOSCOPY (EGD) WITH PROPOFOL;  Surgeon: Rush Landmark Telford Nab., MD;  Location: [facility];  Service: Gastroenterology;  Laterality: N/A;  . ESOPHAGOGASTRODUODENOSCOPY (EGD) WITH PROPOFOL N/A 12/01/2018   Procedure: ESOPHAGOGASTRODUODENOSCOPY (EGD) WITH PROPOFOL;  Surgeon: Rush Landmark Telford Nab., MD;  Location: [facility];  Service: Gastroenterology;  Laterality: N/A;  . EUS N/A 04/12/2018   Procedure: UPPER ENDOSCOPIC ULTRASOUND (EUS) RADIAL;  Surgeon: Rush Landmark Telford Nab., MD;  Location: [facility];  Service: Gastroenterology;  Laterality: N/A;  . EUS N/A 12/01/2018   Procedure: UPPER ENDOSCOPIC ULTRASOUND (EUS) RADIAL;  Surgeon: Rush Landmark Telford Nab., MD;  Location: [facility];  Service: Gastroenterology;  Laterality: N/A;  . HEMOSTASIS CLIP PLACEMENT  12/01/2018   Procedure: HEMOSTASIS CLIP PLACEMENT;  Surgeon: Irving Copas., MD;  Location: [facility];  Service: Gastroenterology;;  . HERNIA REPAIR     abdominal hernia   . HIATAL HERNIA  REPAIR    . NISSEN FUNDOPLICATION    . SEPTOPLASTY N/A 05/16/2014   Procedure: SEPTOPLASTY;  Surgeon: Leta Baptist, MD;  Location: [facility];  Service: ENT;  Laterality: N/A;  . SUBMUCOSAL LIFTING INJECTION  04/12/2018   Procedure: SUBMUCOSAL LIFTING INJECTION;  Surgeon: Irving Copas.,  MD;  Location: [facility];  Service: Gastroenterology;;  . Lia Foyer LIFTING INJECTION  12/01/2018   Procedure: SUBMUCOSAL LIFTING INJECTION;  Surgeon: Irving Copas., MD;  Location: [facility];  Service: Gastroenterology;;  . Lia Foyer TATTOO INJECTION  04/12/2018   Procedure: SUBMUCOSAL TATTOO INJECTION;  Surgeon: Irving Copas., MD;  Location: [facility];  Service: Gastroenterology;;  . TONSILLECTOMY    . TURBINATE REDUCTION Bilateral 05/16/2014   Procedure: BILATERAL TURBINATE REDUCTION;  Surgeon: Leta Baptist, MD;  Location: [facility];  Service: ENT;  Laterality: Bilateral;  . UPPER GI ENDOSCOPY     In [place] in 2012-2013    Family History  Problem Relation Age of Onset  . Colon cancer Neg Hx   . Esophageal cancer Neg Hx   . Breast cancer Neg Hx   . Stomach cancer Neg Hx     Social History   Socioeconomic History  . Marital status: Married    Spouse name: Not on file  . Number of children: 2  . Years of education: Not on file  . Highest education level: Not on file  Occupational History  . Occupation: attorney  Tobacco Use  . Smoking status: Never Smoker  . Smokeless tobacco: Never Used  Substance and Sexual Activity  . Alcohol use: Yes    Comment: occ  . Drug use: No  . Sexual activity: Not on file  Other Topics Concern  . Not on file  Social History Narrative  . Not on file   Social Determinants of Health   Financial Resource Strain:   . Difficulty of Paying Living Expenses:   Food Insecurity:   . Worried About Charity fundraiser in the Last Year:   . Arboriculturist in the Last Year:   Transportation Needs:   . Film/video editor (Medical):   Marland Kitchen Lack of Transportation (Non-Medical):   Physical Activity:   . Days of Exercise per Week:   . Minutes of Exercise per Session:   Stress:   . Feeling of Stress :   Social Connections:   . Frequency of Communication with Friends and Family:   . Frequency of Social  Gatherings with Friends and Family:   . Attends Religious Services:   . Active Member of Clubs or Organizations:   . Attends Archivist Meetings:   Marland Kitchen Marital Status:   Intimate Partner Violence:   . Fear of Current or Ex-Partner:   . Emotionally Abused:   Marland Kitchen Physically Abused:   . Sexually Abused:      Current Outpatient Medications:  .  budesonide (RHINOCORT ALLERGY) 32 MCG/ACT nasal spray, Place 1 spray into both nostrils daily as needed for allergies. , Disp: , Rfl:  .  estradiol (VIVELLE-DOT) 0.05 MG/24HR patch, Place 1 patch onto the skin 2 (two) times a week. , Disp: , Rfl:  .  hyoscyamine (LEVSIN SL) 0.125 MG SL tablet, Take every 4-6 hours as needed, Disp: 60 tablet, Rfl: 5 .  levocetirizine (XYZAL) 5 MG tablet, Take 5 mg by mouth daily as needed for allergies. , Disp: , Rfl:  .  omeprazole (PRILOSEC) 40 MG capsule, Take 1 capsule (40 mg total)  by mouth daily., Disp: 30 capsule, Rfl: 1 .  spironolactone (ALDACTONE) 100 MG tablet, Take 1 tablet (100 mg total) by mouth daily., Disp: 90 tablet, Rfl: 1 .  sucralfate (CARAFATE) 1 g tablet, Take 1 tablet (1 g total) by mouth 4 (four) times daily. (Patient taking differently: Take 1 g by mouth 4 (four) times daily as needed (stomach issues). ), Disp: 120 tablet, Rfl: 0 .  tretinoin (RETIN-A) 0.1 % cream, Apply topically 2 (two) times daily., Disp: 45 g, Rfl: 12 .  valACYclovir (VALTREX) 1000 MG tablet, Take 1 tablet (1,000 mg total) by mouth daily., Disp: 90 tablet, Rfl: 2 .  YUVAFEM 10 MCG TABS vaginal tablet, Place 1 tablet vaginally 2 (two) times a week. , Disp: , Rfl:  .  acyclovir ointment (ZOVIRAX) 5 %, Apply 1 application topically every 3 (three) hours. Use for up to 4 days for outbreak, Disp: 15 g, Rfl: 1  EXAM:  VITALS per patient if applicable: Wt 140 lb (63.5 kg)   BMI 23.66 kg/m   GENERAL: alert, oriented, appears well and in no acute distress  HEENT: atraumatic, conjunttiva clear, no obvious abnormalities on  inspection of external nose and ears  NECK: normal movements of the head and neck  LUNGS: on inspection no signs of respiratory distress, breathing rate appears normal, no obvious gross SOB, gasping or wheezing  CV: no obvious cyanosis  MS: moves all visible extremities without noticeable abnormality  PSYCH/NEURO: pleasant and cooperative, no obvious depression or anxiety, speech and thought processing grossly intact  ASSESSMENT AND PLAN:  Discussed the following assessment and plan:  Recurrent cold sores Increase valtrex to 1g daily Add zovirax cream as needed for outbreak.  Discussed addition to L-lysine supplement 500mg  1-2x per day.  Call if continues to have recurrent outbreaks  30 minutes spent including pre visit preparation, review of prior notes and labs, encounter with patient via video visit and same day documentation.    I discussed the assessment and treatment plan with the patient. The patient was provided an opportunity to ask questions and all were answered. The patient agreed with the plan and demonstrated an understanding of the instructions.   The patient was advised to call back or seek an in-person evaluation if the symptoms worsen or if the condition fails to improve as anticipated.    Luetta Nutting, DO

## 2019-06-07 NOTE — Progress Notes (Signed)
Over the last month, starting to feel extremely fatigued. Thought it was sinuses or allergies accompanied by a cold sore.   Feeling like she has the flu when she has the cold sore.  Tried taking valcyclovir as maintenance.  Possibly the dosage is too low.

## 2019-06-07 NOTE — Assessment & Plan Note (Signed)
Increase valtrex to 1g daily Add zovirax cream as needed for outbreak.  Discussed addition to L-lysine supplement 500mg  1-2x per day.  Call if continues to have recurrent outbreaks

## 2019-06-07 NOTE — Patient Instructions (Signed)
I have increased your valacyclovir to 1000mg  daily I have added zovirax cream to use as needed for breakthrough symptoms.  Try adding L-lysine supplement 500mg  1-2x per day each day.

## 2019-06-14 NOTE — Telephone Encounter (Signed)
Received fax from [employer] they denied coverage on Acyclovier ointment due to it is only covered with a diagnosis of initial treatment of genital herpes or non life threatening mucocutaneous heres simplex virus infections in immunocompromised patients. Placing in providers box for review. - CF

## 2019-06-21 ENCOUNTER — Ambulatory Visit: Payer: 59 | Admitting: Family Medicine

## 2019-06-22 ENCOUNTER — Encounter: Payer: Self-pay | Admitting: Family Medicine

## 2019-06-22 ENCOUNTER — Other Ambulatory Visit: Payer: Self-pay

## 2019-06-22 ENCOUNTER — Ambulatory Visit (INDEPENDENT_AMBULATORY_CARE_PROVIDER_SITE_OTHER): Payer: 59 | Admitting: Family Medicine

## 2019-06-22 VITALS — BP 108/76 | HR 89 | Ht 64.5 in | Wt 144.2 lb

## 2019-06-22 DIAGNOSIS — M79645 Pain in left finger(s): Secondary | ICD-10-CM | POA: Diagnosis not present

## 2019-06-22 NOTE — Progress Notes (Signed)
   I, Wendy Poet, LAT, ATC, am serving as scribe for Dr. Lynne Leader.  Jean Kemp is a 49 y.o. female who presents to [facility] at [facility] today for f/u of L ulnar-sided thumb IPJ pain.  She was last seen by Dr. Georgina Snell on 05/10/19 and was referred to hand therapy.  She had been using a hand/thumb splint/brace.  Since her last visit, pt reports that her L thumb swelling and pain has improved. She has been going to PT and a custom splint has been made.  She has completed 4-5 sessions.  Her most aggravating activity is resisted thumb flexion.   Pertinent review of systems: No fevers or chills  Relevant historical information: Hyperlipidemia   Exam:  BP 108/76 (BP Location: Right Arm, Patient Position: Sitting, Cuff Size: Normal)   Pulse 89   Ht 5' 4.5" (1.638 m)   Wt 144 lb 3.2 oz (65.4 kg)   SpO2 97%   BMI 24.37 kg/m  General: Well Developed, well nourished, and in no acute distress.   MSK: Left hand normal-appearing Specific focus on thumb IP joint normal-appearing nontender no laxity to ulnar stress test. Normal motion and strength.     Assessment and Plan: 49 y.o. female with left thumb injury.  Patient longer has any obvious laxity at ulnar collateral ligament at left thumb IP joint.  She still having some stiffness and dysfunction and is working with hand physical therapy which is working quite well.  At this point likely dealing with more traumatic synovitis than anything else.  Continue to progress with hand PT and home exercise program and recheck back with me as needed.  Precautions reviewed.      Discussed warning signs or symptoms. Please see discharge instructions. Patient expresses understanding.   The above documentation has been reviewed and is accurate and complete Lynne Leader, M.D.  Total encounter time 20 minutes including charting time date of service. Next steps treatment plan and options.

## 2019-06-22 NOTE — Patient Instructions (Signed)
Thank you for coming in today. Continue with hand PT progression.  I do think you have laxity of the ligament I was worried about anymore.  This should improve.  If not better I can do an injection.  Recheck with me as needed in the future.

## 2019-09-30 IMAGING — DX DG ABDOMEN ACUTE W/ 1V CHEST
3 series · 3 of 3 positions shown · non-contrast
Comparison: None.

CLINICAL DATA: Epigastric pain for 1 week.

EXAM:
DG ABDOMEN ACUTE W/ 1V CHEST

[abdomen kub (1 of 2)]
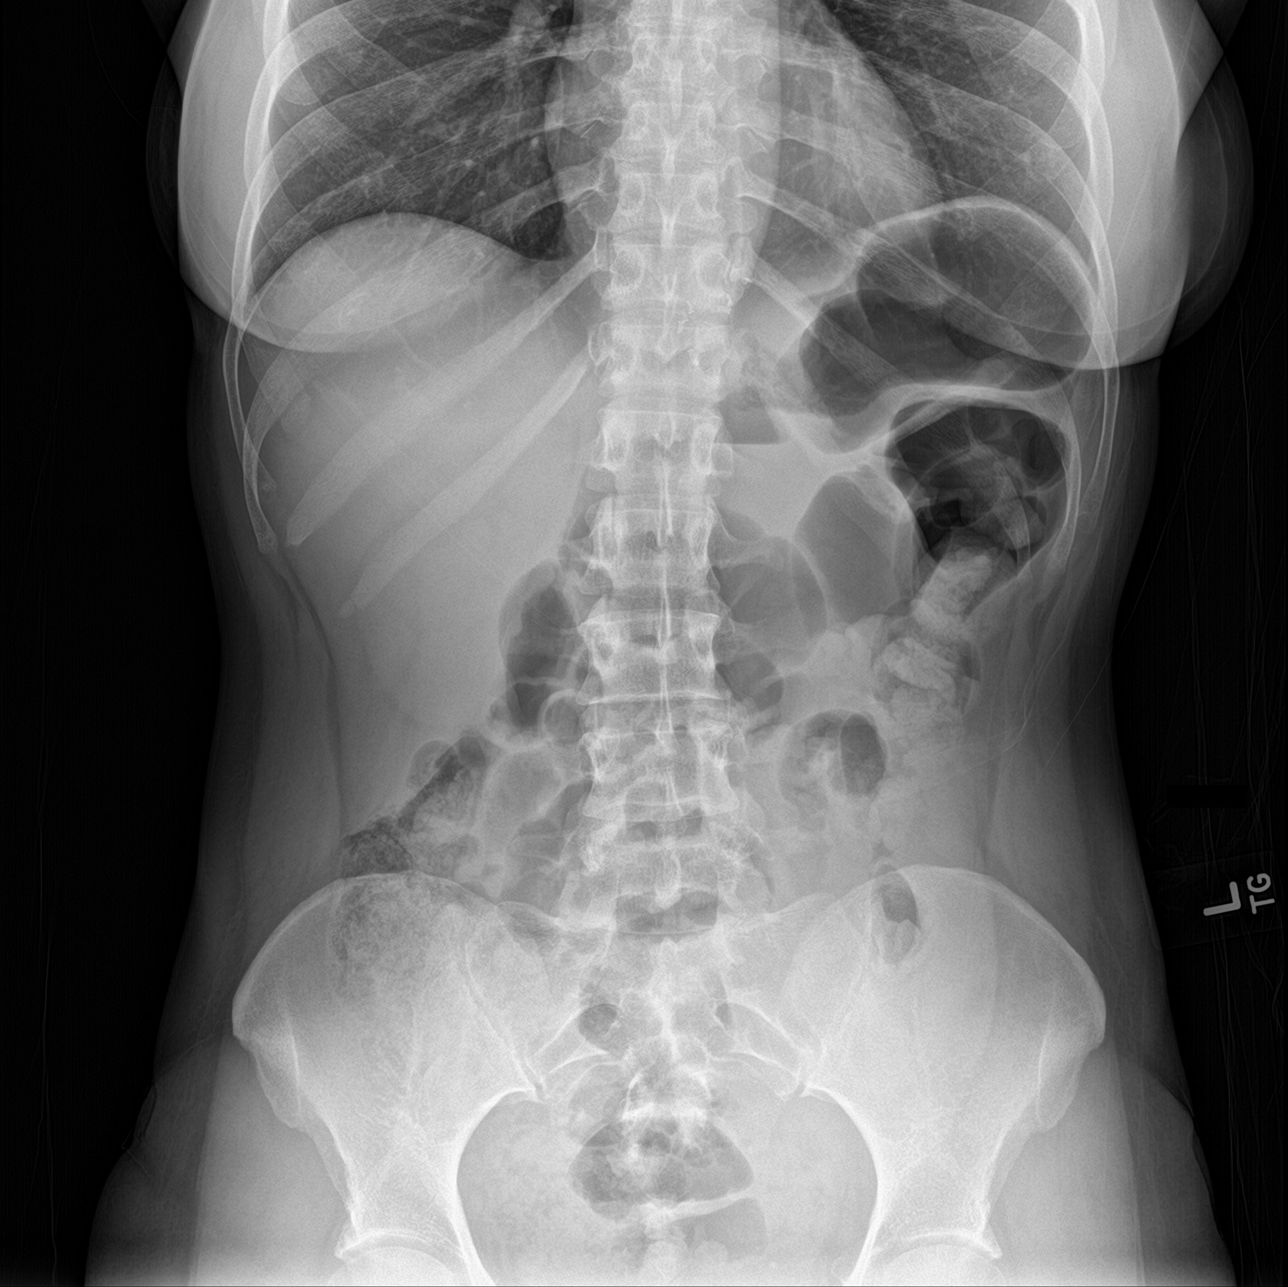

[abdomen kub (2 of 2)]
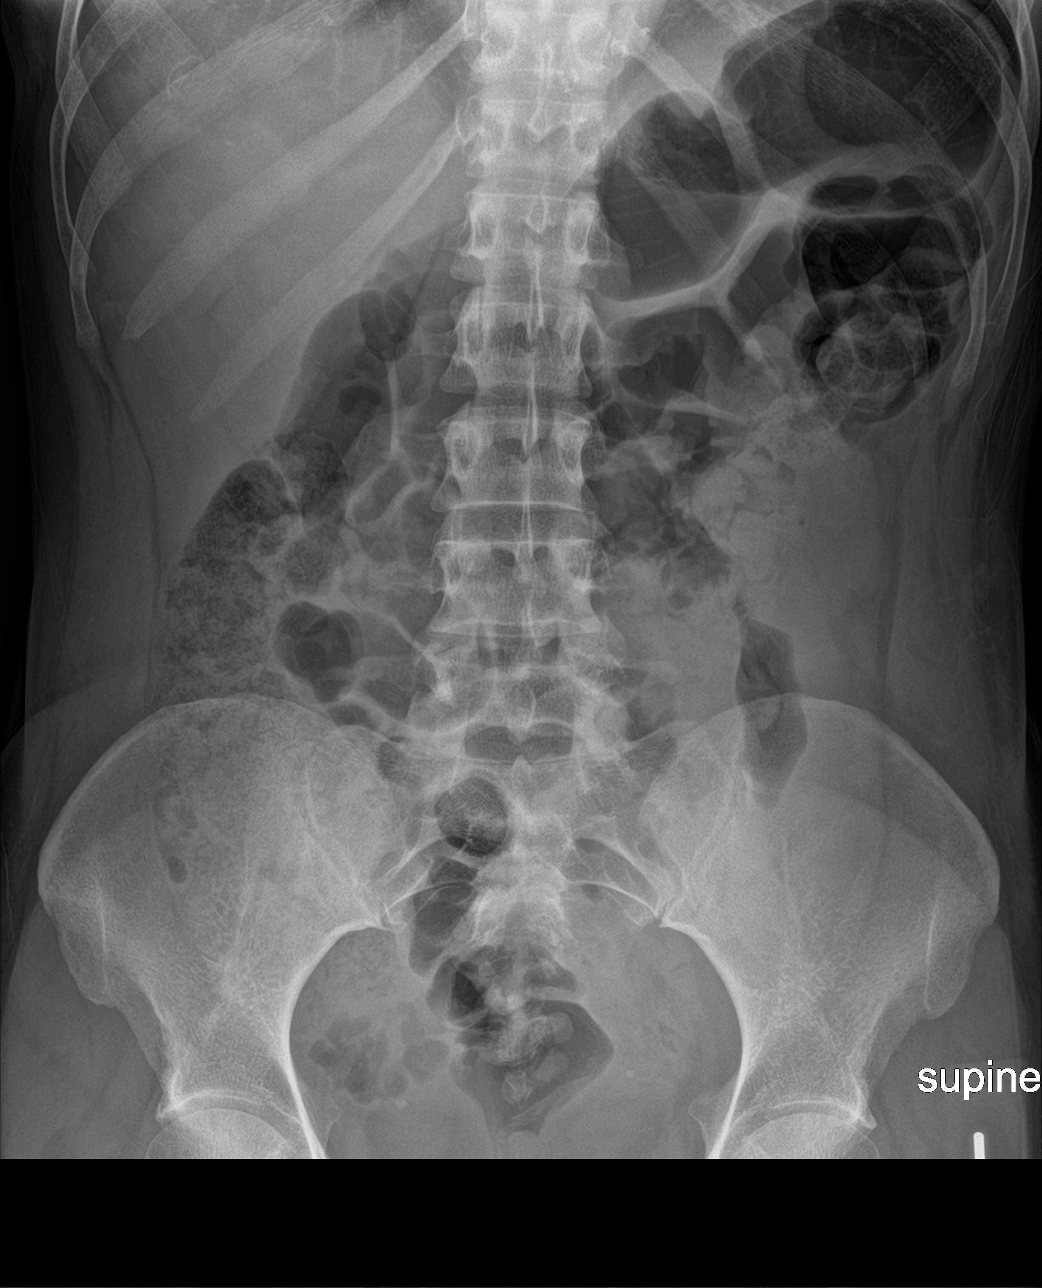

[chest pa]
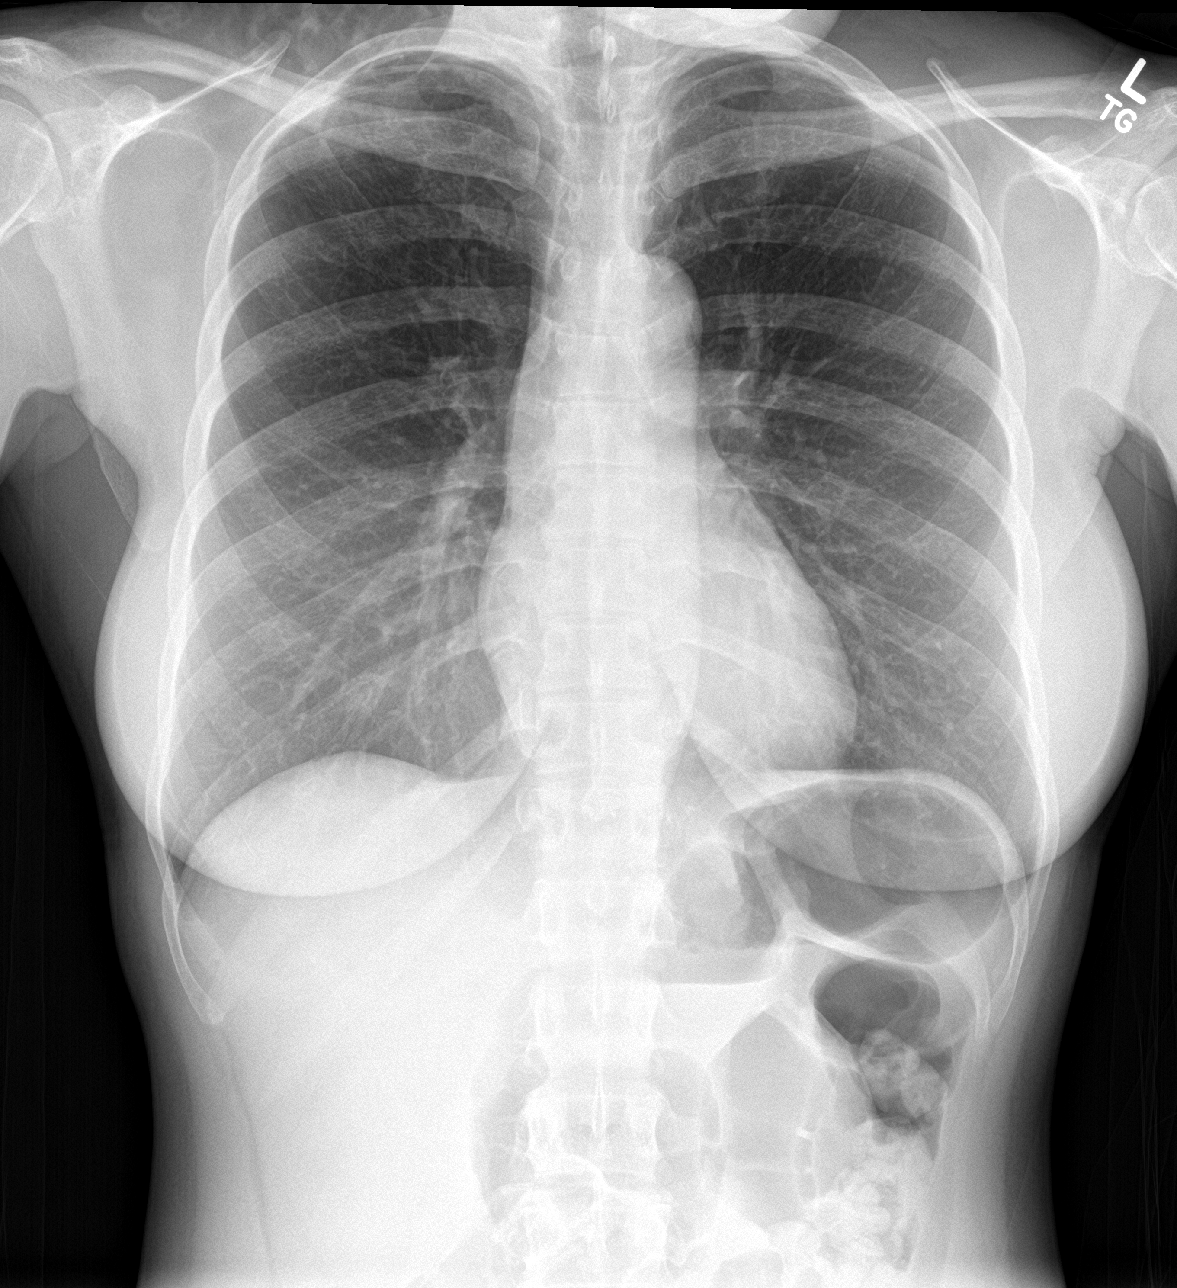

[3 of 3 positions shown; findings below may reference images not displayed]

FINDINGS: Cardiomediastinal silhouette is unremarkable.

The lungs are clear.

No pleural effusion, airspace disease or pneumothorax.

A moderate amount of stool and gas in the proximal-mid colon noted.

Nondistended gas-filled loops of small bowel are present.

No evidence of pneumoperitoneum. No suspicious calcifications are
identified.

No acute bony abnormalities are present.
IMPRESSION: Unremarkable bowel gas pattern except for moderate stool and gas in
the proximal-mid colon. No evidence of bowel obstruction or
pneumoperitoneum.

No evidence of active cardiopulmonary disease

## 2019-11-08 ENCOUNTER — Other Ambulatory Visit: Payer: Self-pay | Admitting: Family Medicine

## 2020-02-06 ENCOUNTER — Telehealth: Payer: Self-pay | Admitting: Neurology

## 2020-02-06 NOTE — Telephone Encounter (Signed)
Prior Authorization for Tretinoin Cream submitted via covermymeds. Awaiting response.

## 2020-02-21 ENCOUNTER — Other Ambulatory Visit: Payer: Self-pay | Admitting: Family Medicine

## 2020-03-23 ENCOUNTER — Other Ambulatory Visit: Payer: Self-pay

## 2020-03-23 MED ORDER — TRETINOIN 0.1 % EX CREA: TOPICAL_CREAM | Freq: Two times a day (BID) | CUTANEOUS | 12 refills | Status: AC

## 2020-04-16 ENCOUNTER — Telehealth: Payer: Self-pay

## 2020-04-16 NOTE — Telephone Encounter (Signed)
Notification through covermymeds that a PA was needed for patient's trentinoin 0.1% cream.   Attempted to do PA through covermymeds. Received message that an age limit of 69 applied and a call to plan was necessary at [phone]. Called plan to initiate PA. The diagnosis of acne, unspecified would not get the medication approved. She did have acne vulgaris as a diagnosis in 04/2019 which I supplied to plan. This got the medication approved. Plan administrator stated that patient can go to the pharmacy and get her medication now. She did not supply any approval dates, etc.  Left message for patient to let her know.

## 2020-05-20 ENCOUNTER — Other Ambulatory Visit: Payer: Self-pay | Admitting: Family Medicine

## 2020-05-21 NOTE — Telephone Encounter (Signed)
Please contact patient for an appointment. Sending 30 days of medication. No additional refills without appt. Thanks

## 2020-05-21 NOTE — Telephone Encounter (Signed)
LVM for patient to call back to get appt scheduled for any further refills. AM °

## 2020-06-18 ENCOUNTER — Other Ambulatory Visit: Payer: Self-pay | Admitting: Family Medicine

## 2020-09-24 ENCOUNTER — Other Ambulatory Visit: Payer: Self-pay | Admitting: Family Medicine

## 2020-09-24 NOTE — Telephone Encounter (Signed)
Called and LVM for patient to call back to schedule appointment for any further refills. AM

## 2020-09-24 NOTE — Telephone Encounter (Signed)
Please contact patient for follow-up appt. Last visit 04/2019.  Sending 15 day med refill.  Thank you

## 2020-10-11 ENCOUNTER — Other Ambulatory Visit: Payer: Self-pay | Admitting: Family Medicine
